# Patient Record
Sex: Female | Born: 1950 | Race: White | Hispanic: No | Smoking: Former smoker
Health system: Southern US, Community
[De-identification: ages and names within clinical notes are randomized; demographics above are authoritative.]

## PROBLEM LIST (undated history)

## (undated) DIAGNOSIS — F32A Depression, unspecified: Secondary | ICD-10-CM

## (undated) DIAGNOSIS — Z86010 Personal history of colonic polyps: Principal | ICD-10-CM

## (undated) DIAGNOSIS — I10 Essential (primary) hypertension: Secondary | ICD-10-CM

## (undated) DIAGNOSIS — C801 Malignant (primary) neoplasm, unspecified: Secondary | ICD-10-CM

## (undated) DIAGNOSIS — B019 Varicella without complication: Secondary | ICD-10-CM

## (undated) DIAGNOSIS — M1990 Unspecified osteoarthritis, unspecified site: Secondary | ICD-10-CM

## (undated) DIAGNOSIS — F329 Major depressive disorder, single episode, unspecified: Secondary | ICD-10-CM

## (undated) DIAGNOSIS — E079 Disorder of thyroid, unspecified: Secondary | ICD-10-CM

## (undated) DIAGNOSIS — N809 Endometriosis, unspecified: Secondary | ICD-10-CM

## (undated) DIAGNOSIS — J45909 Unspecified asthma, uncomplicated: Secondary | ICD-10-CM

## (undated) DIAGNOSIS — K644 Residual hemorrhoidal skin tags: Secondary | ICD-10-CM

## (undated) DIAGNOSIS — K648 Other hemorrhoids: Secondary | ICD-10-CM

## (undated) DIAGNOSIS — J189 Pneumonia, unspecified organism: Secondary | ICD-10-CM

## (undated) DIAGNOSIS — Z87440 Personal history of urinary (tract) infections: Secondary | ICD-10-CM

## (undated) DIAGNOSIS — K641 Second degree hemorrhoids: Secondary | ICD-10-CM

## (undated) DIAGNOSIS — J849 Interstitial pulmonary disease, unspecified: Secondary | ICD-10-CM

## (undated) DIAGNOSIS — E039 Hypothyroidism, unspecified: Secondary | ICD-10-CM

## (undated) DIAGNOSIS — T7840XA Allergy, unspecified, initial encounter: Secondary | ICD-10-CM

## (undated) HISTORY — DX: Disorder of thyroid, unspecified: E07.9

## (undated) HISTORY — PX: OTHER SURGICAL HISTORY: SHX169

## (undated) HISTORY — DX: Major depressive disorder, single episode, unspecified: F32.9

## (undated) HISTORY — DX: Unspecified osteoarthritis, unspecified site: M19.90

## (undated) HISTORY — DX: Unspecified asthma, uncomplicated: J45.909

## (undated) HISTORY — DX: Pneumonia, unspecified organism: J18.9

## (undated) HISTORY — DX: Varicella without complication: B01.9

## (undated) HISTORY — DX: Interstitial pulmonary disease, unspecified: J84.9

## (undated) HISTORY — DX: Personal history of colonic polyps: Z86.010

## (undated) HISTORY — DX: Depression, unspecified: F32.A

## (undated) HISTORY — PX: COLONOSCOPY W/ POLYPECTOMY: SHX1380

## (undated) HISTORY — DX: Residual hemorrhoidal skin tags: K64.4

## (undated) HISTORY — DX: Endometriosis, unspecified: N80.9

## (undated) HISTORY — DX: Essential (primary) hypertension: I10

## (undated) HISTORY — DX: Personal history of urinary (tract) infections: Z87.440

## (undated) HISTORY — DX: Other hemorrhoids: K64.8

## (undated) HISTORY — PX: CARDIAC CATHETERIZATION: SHX172

## (undated) HISTORY — DX: Hypothyroidism, unspecified: E03.9

## (undated) HISTORY — PX: HEMORRHOID BANDING: SHX5850

## (undated) HISTORY — DX: Allergy, unspecified, initial encounter: T78.40XA

## (undated) HISTORY — DX: Second degree hemorrhoids: K64.1

## (undated) HISTORY — DX: Malignant (primary) neoplasm, unspecified: C80.1

---

## 1998-07-11 ENCOUNTER — Inpatient Hospital Stay (HOSPITAL_COMMUNITY): Admission: AD | Admit: 1998-07-11 | Discharge: 1998-07-11 | Payer: Self-pay | Admitting: Obstetrics

## 1998-07-16 ENCOUNTER — Encounter: Payer: Self-pay | Admitting: Obstetrics and Gynecology

## 1998-07-16 ENCOUNTER — Ambulatory Visit (HOSPITAL_COMMUNITY): Admission: RE | Admit: 1998-07-16 | Discharge: 1998-07-16 | Payer: Self-pay | Admitting: Obstetrics

## 1998-10-02 ENCOUNTER — Other Ambulatory Visit: Admission: RE | Admit: 1998-10-02 | Discharge: 1998-10-02 | Payer: Self-pay | Admitting: Obstetrics and Gynecology

## 1998-10-02 ENCOUNTER — Encounter (INDEPENDENT_AMBULATORY_CARE_PROVIDER_SITE_OTHER): Payer: Self-pay | Admitting: Specialist

## 1999-01-12 ENCOUNTER — Other Ambulatory Visit: Admission: RE | Admit: 1999-01-12 | Discharge: 1999-01-12 | Payer: Self-pay | Admitting: Obstetrics and Gynecology

## 1999-07-13 ENCOUNTER — Other Ambulatory Visit: Admission: RE | Admit: 1999-07-13 | Discharge: 1999-07-13 | Payer: Self-pay | Admitting: Obstetrics and Gynecology

## 2000-08-22 ENCOUNTER — Encounter: Admission: RE | Admit: 2000-08-22 | Discharge: 2000-08-22 | Payer: Self-pay | Admitting: Internal Medicine

## 2000-08-22 ENCOUNTER — Encounter: Payer: Self-pay | Admitting: Internal Medicine

## 2002-07-30 ENCOUNTER — Other Ambulatory Visit: Admission: RE | Admit: 2002-07-30 | Discharge: 2002-07-30 | Payer: Self-pay | Admitting: Obstetrics and Gynecology

## 2003-10-30 ENCOUNTER — Other Ambulatory Visit: Admission: RE | Admit: 2003-10-30 | Discharge: 2003-10-30 | Payer: Self-pay | Admitting: Obstetrics and Gynecology

## 2004-01-21 ENCOUNTER — Ambulatory Visit: Payer: Self-pay | Admitting: Endocrinology

## 2004-03-17 ENCOUNTER — Ambulatory Visit: Payer: Self-pay | Admitting: Endocrinology

## 2004-05-29 ENCOUNTER — Ambulatory Visit: Payer: Self-pay | Admitting: Endocrinology

## 2004-06-10 ENCOUNTER — Ambulatory Visit: Payer: Self-pay | Admitting: Endocrinology

## 2004-06-19 ENCOUNTER — Ambulatory Visit: Payer: Self-pay | Admitting: Endocrinology

## 2004-12-01 ENCOUNTER — Ambulatory Visit: Payer: Self-pay | Admitting: Endocrinology

## 2005-02-03 ENCOUNTER — Ambulatory Visit: Payer: Self-pay | Admitting: Internal Medicine

## 2005-04-13 ENCOUNTER — Ambulatory Visit: Payer: Self-pay | Admitting: Internal Medicine

## 2005-06-24 ENCOUNTER — Ambulatory Visit: Payer: Self-pay | Admitting: Internal Medicine

## 2005-08-17 ENCOUNTER — Ambulatory Visit: Payer: Self-pay | Admitting: Internal Medicine

## 2005-08-18 ENCOUNTER — Ambulatory Visit: Payer: Self-pay | Admitting: Internal Medicine

## 2010-02-22 HISTORY — PX: TOTAL KNEE ARTHROPLASTY: SHX125

## 2010-03-06 ENCOUNTER — Ambulatory Visit (HOSPITAL_COMMUNITY)
Admission: RE | Admit: 2010-03-06 | Discharge: 2010-03-06 | Payer: Self-pay | Source: Home / Self Care | Attending: Cardiovascular Disease | Admitting: Cardiovascular Disease

## 2010-03-09 LAB — GLUCOSE, CAPILLARY: Glucose-Capillary: 82 mg/dL (ref 70–99)

## 2010-03-19 NOTE — Procedures (Addendum)
  Stacy Tucker, Stacy Tucker           ACCOUNT NO.:  [id number]  MEDICAL RECORD NO.:  [id number]          PATIENT TYPE:  OIB  LOCATION:  2899                         FACILITY:  [facility]  PHYSICIAN:  Thurmon Fair, MD     DATE OF BIRTH:  [birth year]  DATE OF PROCEDURE: DATE OF DISCHARGE:  03/06/2010                           CARDIAC CATHETERIZATION   PROCEDURES PERFORMED: 1. Left heart catheterization. 2. Selective coronary angiography. 3. Left ventriculography.  [name] is a 60 year old woman with atypical chest discomfort and an abnormal myocardial perfusion study suggestive of ischemia in the anterior wall.  After risks and benefits of the procedure were described, she provided informed consent, and was brought to the cardiac cath lab in fasting state.  She was prepped and draped in usual sterile fashion.  Using the modified Seldinger technique, a 5-French right common femoral sheath was introduced without difficulty.  Using 5-French JL-4 JR-4, and angled pigtail catheters selective cannulation of the left and right coronary arteries and left ventricle respectively performed.  Multiple coronary angiograms and a variety of projections as well as in the RAO projection, left ventriculogram were recorded.  A pullback from the left ventricle to the aorta was also recorded.  No immediate complications occurred.  FINDINGS:  [name] has normal coronary arteries.  There is no evidence of luminal irregularities to suggest the meaningful atherosclerosis.  There are definitely no hemodynamically significant stenoses.  The circulation is right coronary artery dominant, but right coronary is relatively small and provides fairly short PDA and PLV branches.  The circumflex system is large providing four major oblique marginal arteries.  The LAD artery generates only one important diagonal branch.  The left ventricle is normal in size, regional wall motion overall systolic function  with ejection fraction of 55-60%.  The left ventricular end-diastolic pressure is minimally elevated at 18 mmHg. This is probably attributable to hypertension and left ventricular hypertrophy.  CONCLUSION:  [name] has normal coronary arteries.  The myocardial perfusion defect is likely due to "shifting breast" attenuation artifact.  No further coronary workup is necessary at this time.     Thurmon Fair, MD     MC/MEDQ  D:  03/06/2010  T:  03/07/2010  Job:  [id number]  cc:   [facility]  Electronically Signed by Thurmon Fair M.D. on 03/19/2010 12:15:21 PM

## 2010-07-17 ENCOUNTER — Other Ambulatory Visit (HOSPITAL_COMMUNITY): Payer: Self-pay

## 2010-07-17 ENCOUNTER — Encounter (HOSPITAL_COMMUNITY)
Admission: RE | Admit: 2010-07-17 | Discharge: 2010-07-17 | Disposition: A | Discharge status: Home / Self Care | Payer: BC Managed Care – PPO | Source: Ambulatory Visit | Attending: Orthopaedic Surgery | Admitting: Orthopaedic Surgery

## 2010-07-17 ENCOUNTER — Other Ambulatory Visit (HOSPITAL_COMMUNITY): Payer: BC Managed Care – PPO

## 2010-07-17 LAB — BASIC METABOLIC PANEL
BUN: 13 mg/dL (ref 6–23)
CO2: 28 mEq/L (ref 19–32)
Calcium: 10 mg/dL (ref 8.4–10.5)
Chloride: 105 mEq/L (ref 96–112)
Creatinine, Ser: 0.73 mg/dL (ref 0.4–1.2)
GFR calc Af Amer: >60 mL/min (ref >60)
GFR calc non Af Amer: >60 mL/min (ref >60)
Glucose, Bld: 109 mg/dL — ABNORMAL HIGH (ref 70–99)
Potassium: 4.7 mEq/L (ref 3.5–5.1)
Sodium: 139 mEq/L (ref 135–145)

## 2010-07-17 LAB — SURGICAL PCR SCREEN
MRSA, PCR: NEGATIVE
Staphylococcus aureus: NEGATIVE

## 2010-07-17 LAB — CBC
HCT: 37.7 % (ref 36.0–46.0)
Hemoglobin: 12.8 g/dL (ref 12.0–15.0)
MCH: 30.1 pg (ref 26.0–34.0)
MCHC: 34 g/dL (ref 30.0–36.0)
MCV: 88.7 fL (ref 78.0–100.0)
Platelets: 227 10*3/uL (ref 150–400)
RBC: 4.25 MIL/uL (ref 3.87–5.11)
RDW: 13 % (ref 11.5–15.5)
WBC: 7.1 10*3/uL (ref 4.0–10.5)

## 2010-07-17 LAB — TYPE AND SCREEN
ABO/RH(D): O POS
Antibody Screen: NEGATIVE

## 2010-07-17 LAB — PROTIME-INR
INR: 0.93 (ref 0.00–1.49)
Prothrombin Time: 12.7 seconds (ref 11.6–15.2)

## 2010-07-17 LAB — ABO/RH: ABO/RH(D): O POS

## 2010-07-17 LAB — APTT: aPTT: 30 seconds (ref 24–37)

## 2010-07-21 ENCOUNTER — Inpatient Hospital Stay (HOSPITAL_COMMUNITY)
Admission: RE | Admit: 2010-07-21 | Discharge: 2010-07-24 | DRG: 209 | Disposition: A | Discharge status: Home Health | Payer: BC Managed Care – PPO | Source: Ambulatory Visit | Attending: Orthopaedic Surgery | Admitting: Orthopaedic Surgery

## 2010-07-21 ENCOUNTER — Ambulatory Visit (HOSPITAL_COMMUNITY)
Admission: RE | Admit: 2010-07-21 | Discharge: 2010-07-21 | Disposition: A | Discharge status: Home / Self Care | Payer: BC Managed Care – PPO | Source: Ambulatory Visit | Attending: Orthopaedic Surgery | Admitting: Orthopaedic Surgery

## 2010-07-21 ENCOUNTER — Other Ambulatory Visit (HOSPITAL_COMMUNITY): Payer: Self-pay | Admitting: Orthopaedic Surgery

## 2010-07-21 DIAGNOSIS — F329 Major depressive disorder, single episode, unspecified: Secondary | ICD-10-CM | POA: Diagnosis present

## 2010-07-21 DIAGNOSIS — Z882 Allergy status to sulfonamides status: Secondary | ICD-10-CM

## 2010-07-21 DIAGNOSIS — F3289 Other specified depressive episodes: Secondary | ICD-10-CM | POA: Diagnosis present

## 2010-07-21 DIAGNOSIS — E039 Hypothyroidism, unspecified: Secondary | ICD-10-CM | POA: Diagnosis present

## 2010-07-21 DIAGNOSIS — M1712 Unilateral primary osteoarthritis, left knee: Secondary | ICD-10-CM

## 2010-07-21 DIAGNOSIS — M1710 Unilateral primary osteoarthritis, unspecified knee: Principal | ICD-10-CM | POA: Diagnosis present

## 2010-07-21 DIAGNOSIS — Z880 Allergy status to penicillin: Secondary | ICD-10-CM

## 2010-07-22 LAB — BASIC METABOLIC PANEL
BUN: 13 mg/dL (ref 6–23)
CO2: 29 mEq/L (ref 19–32)
Calcium: 8.8 mg/dL (ref 8.4–10.5)
Chloride: 100 mEq/L (ref 96–112)
Creatinine, Ser: 0.67 mg/dL (ref 0.4–1.2)
GFR calc Af Amer: >60 mL/min (ref >60)
GFR calc non Af Amer: >60 mL/min (ref >60)
Glucose, Bld: 128 mg/dL — ABNORMAL HIGH (ref 70–99)
Potassium: 4.7 mEq/L (ref 3.5–5.1)
Sodium: 136 mEq/L (ref 135–145)

## 2010-07-22 LAB — CBC
HCT: 34.7 % — ABNORMAL LOW (ref 36.0–46.0)
Hemoglobin: 11.5 g/dL — ABNORMAL LOW (ref 12.0–15.0)
MCH: 29.9 pg (ref 26.0–34.0)
MCHC: 33.1 g/dL (ref 30.0–36.0)
MCV: 90.4 fL (ref 78.0–100.0)
Platelets: 197 10*3/uL (ref 150–400)
RBC: 3.84 MIL/uL — ABNORMAL LOW (ref 3.87–5.11)
RDW: 13.2 % (ref 11.5–15.5)
WBC: 14.7 10*3/uL — ABNORMAL HIGH (ref 4.0–10.5)

## 2010-07-22 LAB — PROTIME-INR
INR: 1.13 (ref 0.00–1.49)
Prothrombin Time: 14.7 seconds (ref 11.6–15.2)

## 2010-07-23 LAB — PROTIME-INR
INR: 1.41 (ref 0.00–1.49)
Prothrombin Time: 17.5 seconds — ABNORMAL HIGH (ref 11.6–15.2)

## 2010-07-23 LAB — BASIC METABOLIC PANEL
BUN: 11 mg/dL (ref 6–23)
CO2: 29 mEq/L (ref 19–32)
Calcium: 8.5 mg/dL (ref 8.4–10.5)
Chloride: 102 mEq/L (ref 96–112)
Creatinine, Ser: 0.66 mg/dL (ref 0.4–1.2)
GFR calc Af Amer: >60 mL/min (ref >60)
GFR calc non Af Amer: >60 mL/min (ref >60)
Glucose, Bld: 136 mg/dL — ABNORMAL HIGH (ref 70–99)
Potassium: 4.1 mEq/L (ref 3.5–5.1)
Sodium: 137 mEq/L (ref 135–145)

## 2010-07-23 LAB — URINE MICROSCOPIC-ADD ON

## 2010-07-23 LAB — CBC
HCT: 30.5 % — ABNORMAL LOW (ref 36.0–46.0)
Hemoglobin: 10.1 g/dL — ABNORMAL LOW (ref 12.0–15.0)
MCH: 29.9 pg (ref 26.0–34.0)
MCHC: 33.1 g/dL (ref 30.0–36.0)
MCV: 90.2 fL (ref 78.0–100.0)
Platelets: 194 10*3/uL (ref 150–400)
RBC: 3.38 MIL/uL — ABNORMAL LOW (ref 3.87–5.11)
RDW: 13.6 % (ref 11.5–15.5)
WBC: 12.1 10*3/uL — ABNORMAL HIGH (ref 4.0–10.5)

## 2010-07-23 LAB — URINALYSIS, ROUTINE W REFLEX MICROSCOPIC
Bilirubin Urine: NEGATIVE
Glucose, UA: NEGATIVE mg/dL
Ketones, ur: NEGATIVE mg/dL
Nitrite: NEGATIVE
Protein, ur: NEGATIVE mg/dL
Specific Gravity, Urine: 1.011 (ref 1.005–1.030)
Urobilinogen, UA: 0.2 mg/dL (ref 0.0–1.0)
pH: 6 (ref 5.0–8.0)

## 2010-07-24 LAB — CBC
HCT: 30.6 % — ABNORMAL LOW (ref 36.0–46.0)
Hemoglobin: 10 g/dL — ABNORMAL LOW (ref 12.0–15.0)
MCH: 29.4 pg (ref 26.0–34.0)
MCHC: 32.7 g/dL (ref 30.0–36.0)
MCV: 90 fL (ref 78.0–100.0)
Platelets: 196 10*3/uL (ref 150–400)
RBC: 3.4 MIL/uL — ABNORMAL LOW (ref 3.87–5.11)
RDW: 13.4 % (ref 11.5–15.5)
WBC: 8.6 10*3/uL (ref 4.0–10.5)

## 2010-07-24 LAB — BASIC METABOLIC PANEL
BUN: 11 mg/dL (ref 6–23)
CO2: 30 mEq/L (ref 19–32)
Calcium: 8.6 mg/dL (ref 8.4–10.5)
Chloride: 103 mEq/L (ref 96–112)
Creatinine, Ser: 0.58 mg/dL (ref 0.4–1.2)
GFR calc Af Amer: >60 mL/min (ref >60)
GFR calc non Af Amer: >60 mL/min (ref >60)
Glucose, Bld: 114 mg/dL — ABNORMAL HIGH (ref 70–99)
Potassium: 3.8 mEq/L (ref 3.5–5.1)
Sodium: 139 mEq/L (ref 135–145)

## 2010-07-24 LAB — PROTIME-INR
INR: 1.56 — ABNORMAL HIGH (ref 0.00–1.49)
Prothrombin Time: 18.9 seconds — ABNORMAL HIGH (ref 11.6–15.2)

## 2010-07-27 NOTE — Op Note (Signed)
NAMETHAI, HEMRICK           ACCOUNT NO.:  [id number]  MEDICAL RECORD NO.:  [id number]           PATIENT TYPE:  I  LOCATION:  5023                         FACILITY:  [facility]  PHYSICIAN:  Lubertha Basque. Windle Huebert, M.D.DATE OF BIRTH:  [birth year]  DATE OF PROCEDURE:  07/21/2010 DATE OF DISCHARGE:                              OPERATIVE REPORT   PREOPERATIVE DIAGNOSIS:  Left knee degenerative joint disease.  POSTOPERATIVE DIAGNOSIS:  Left knee degenerative joint disease.  PROCEDURE:  Left total knee replacement.  ANESTHESIA:  General and block.  ATTENDING SURGEON:  Lubertha Basque. Jerl Santos, MD  ASSISTANT:  Lindwood Qua, PA   INDICATIONS FOR PROCEDURE:  The patient is a 60 year old woman with a long history of painful left knee.  This has persisted despite oral anti- inflammatories and multiple injectables.  She has pain, which limits her ability to rest and walk.  Preoperative x-ray shows she has advanced degenerative change.  She is offered a knee replacement.  Informed operative consent was obtained after discussion of possible complications including reaction to anesthesia, infection, DVT, PE, and death.  The importance of postoperative rehabilitation protocol to optimize result was stressed with the patient.  SUMMARY OF FINDINGS AND PROCEDURE:  Under general anesthesia and block, a left knee replacement was performed.  She had advanced degenerative change in the medial compartment, but excellent bone quality.  We addressed her problem with cemented DePuy LCS system using standard plus femur, 4 tibia, 10 deep dish spacer, and 38 all polyethylene patella. Bryna Colander assisted throughout and was invaluable to the completion of the case in that he helped position and retract while I performed the procedure.  He also closed simultaneously to help minimize OR time.  I did include Zinacef antibiotic in cement.  DESCRIPTION OF PROCEDURE:  The patient was taken to the operating  suite where general anesthetic was applied without difficulty.  She was also given a block in the preanesthesia area.  She was positioned supine, prepped and draped in normal sterile fashion.  After administration of IV Kefzol, the left leg was elevated, exsanguinated, tourniquet inflated about the thigh.  A longitudinal anterior incision was made with dissection down the extensor mechanism.  All appropriate anti-infected measures were used including closed hooded exhaust systems for each member of surgical team, Betadine impregnated drape, and preoperative IV antibiotic.  A medial parapatellar incision was made.  The kneecap was flipped and the knee flexed.  Some residual meniscal tissues were removed along with the ACL and PCL.  An extramedullary guide was placed in the tibia to make a cut with the rubber with a flat surface.  We then placed an intramedullary guide to the femur to make anterior-posterior cuts creating a flexion gap of 10 mm.  A second intramedullary guide was placed in the femur to make a distal cut creating an equal extension gap of 10 mm.  The femur was sized to a standard plus and the tibia to a 4 with the appropriate guides placed and utilized.  The patella was cut down in thickness by about 10 mm to 15 and sized to a 38 with the appropriate guide placed and utilized.  Trial reduction was done with all these components in the tenth spacer.  She easily came to slight hyperextension and the patella tracked well.  Trial components were removed followed by pulsatile lavage irrigation of all three cut bony surfaces.  Cement was mixed including Zinacef antibiotic.  This was eventually placed onto the bones and pressurized.  The aforementioned DePuy components were then placed.  Excess cement was trimmed and pressure was held in the components until cement hardened.  The tourniquet was deflated and a small amount of bleeding was easily controlled with some pressure and  Bovie cautery.  The knee was irrigated followed by placement of drain exiting superolaterally.  The extensor mechanism was reapproximated with #1 Vicryl in interrupted fashion followed by subcutaneous reapproximation of the patient with 0 and 2-0 undyed Vicryl and skin closure with a running suture of Monocryl.  Steri- Strips were applied followed by Adaptic, dry gauze, and loose Ace wrap. Estimated blood loss and intraoperative fluids can be obtained from anesthesia records as can accurate tourniquet time.  DISPOSITION:  The patient was extubated in the operating room and taken to the recovery room in stable condition.  She was to be admitted to the Orthopedic Surgery Service for appropriate postop care to include perioperative antibiotics and Coumadin plus Lovenox for DVT prophylaxis.     Lubertha Basque Jerl Santos, M.D.     PGD/MEDQ  D:  07/21/2010  T:  07/22/2010  Job:  [id number]  Electronically Signed by Marcene Corning M.D. on 07/27/2010 01:39:35 PM

## 2010-08-25 NOTE — Discharge Summary (Signed)
Stacy Tucker, Stacy Tucker           ACCOUNT NO.:  [id number]  MEDICAL RECORD NO.:  [id number]           PATIENT TYPE:  I  LOCATION:  5023                         FACILITY:  [facility]  PHYSICIAN:  Lubertha Basque. Keishawn Rajewski, M.D.DATE OF BIRTH:  06-May-1950  DATE OF ADMISSION:  07/21/2010 DATE OF DISCHARGE:  07/24/2010                              DISCHARGE SUMMARY   ADMITTING DIAGNOSES: 1. Left knee end-stage degenerative joint disease. 2. Depression. 3. History of hypothyroid.  DISCHARGE DIAGNOSES: 1. Left knee end-stage degenerative joint disease. 2. Depression. 3. History of hypothyroid.  OPERATION:  Left total knee replacement.  BRIEF HISTORY:  Stacy Tucker is 60 years old.  She has had increasing left knee pain unrelieved by oral anti-inflammatory medicines, corticosteroid injections, now is having pain with every step and trouble sleeping at nighttime.  Her x-rays reveal end-stage bone-on-bone DJD.  We have discussed treatment options with her that being total knee replacement.  PERTINENT LABORATORY AND X-RAY FINDINGS:  Urinalysis was done was essentially normal because of urinary burning.  Hemoglobin 10.1, hematocrit 30.5, WBCs 12.5, platelets of 194.  Sodium 137, potassium 4.1, creatinine 0.66, BUN 11, glucose 136, last INR available at dictation 1.41.  COURSE IN THE HOSPITAL:  She was admitted postoperatively, placed on a variety of p.o. and IV analgesics for pain including a PCA pump of Dilaudid.  Her pain controlled on a reduced dose.  Her diet was advanced as tolerated.  IV fluid was continue for the first 24-48 hours. Coumadin and Lovenox DVT prophylaxis protocol by pharmacy and then perioperative antibiotics which was Ancef x3 doses.  Appropriate stool softeners, antiemetics, antispasmodics were also used.  She can be weightbearing as tolerated with physical therapy.  The use of a CPM machine 0-50 and advancing as tolerated and Foley catheter was discontinued as well the  first day postop.  Followup lab studies were done as well and were noted in the chart. First day postop, blood pressure was within normal limits.  Temperature was 98, but did spike to 101 on a couple of occasions during her hospital stay, then a Tylenol was used, which brought her temperature down as well as incentive spirometry and changing some of her medications.  Her lungs were clear.  Abdomen was soft.  She had not had a bowel movement.  Knee was within normal limits on left side without sign of infection.  Negative Homans.  No sign of DVT.  Foley catheter was removed on the second day postop.  The drain was pulled out accidentally, which was fine because it was going to come out anyway. Dressing was changed.  Wound was noted to be benign.  Vital signs remained stable with intermittent spikes of temperature up to 100+, but for the most part, she was in the 98 range.  Her knee showed no sign of infection.  Her lungs were clear and urine was normal.  She progressed well with physical therapy and was discharged home on July 24, 2010.  CONDITION ON DISCHARGE:  Improved.  FOLLOWUP:  She will remain on a low-sodium heart-healthy diet.  May change her dressing daily and home care agency will provide home physical  therapy and blood draws for her on discharge with an INR goal between 2.0 and 3.0 because of Coumadin.  She will be on it for 2 weeks. She was also given appropriate pain medicine prescription and antispasmodic with follow up with [place] in 2 weeks, calling [phone] for an appointment or sooner if there is any sign of infection which would be increasing redness, drainage, or pain.     Lindwood Qua, P.A.   ______________________________ Lubertha Basque. Jerl Santos, M.D.    MC/MEDQ  D:  07/23/2010  T:  07/24/2010  Job:  [id number]  Electronically Signed by Lindwood Qua P.A. on 08/04/2010 04:01:55 PM Electronically Signed by Marcene Corning M.D. on 08/25/2010 05:03:38 PM

## 2010-08-25 NOTE — H&P (Signed)
  Stacy Tucker, Stacy Tucker           ACCOUNT NO.:  [id number]  MEDICAL RECORD NO.:  [id number]           PATIENT TYPE:  LOCATION:                                 FACILITY:  PHYSICIAN:  Lubertha Basque. Roniqua Kintz, M.D.DATE OF BIRTH:  1950-10-03  DATE OF ADMISSION:  07/21/2010 DATE OF DISCHARGE:   07/24/2010                             HISTORY & PHYSICAL   CHIEF COMPLAINT:  Left knee pain.  HISTORY OF PRESENT ILLNESS:  Stacy Tucker is a patient well-known to our practice who was having considerable left knee pain when she ambulates, trouble sleeping at nighttime.  She has undergone Supartz injections and got minimal relief for about 6 months but at this point she is frustrated with knee pain, trouble sleeping at night trouble ambulating, and we have discussed treatment options that being total knee replacement with the risk of anesthesia, infection, DVT, and possible death and significant additional past medical history.  DRUG ALLERGIES:  PENICILLIN, MACRODANTIN, AUGMENTIN, SULFA, but is able to take Keflex without any significant problems.  She also has allergies to VALIUM and PRISTIQ.  CURRENT MEDICATION:  Thyroid replacement.  She does have history of mild depression but is on no medications at this time for this.  She works as a Runner, broadcasting/film/video in [place].  Medical coverage is through Marcelle Overlie who is her OB/GYN doctor.  She does not smoke.  REVIEW OF SYSTEMS:  Additional 14 point review of systems are that she wears glasses, has ringing in her ears, history of a rash, history of depression, history of nervousness.  PREVIOUS HOSPITALIZATIONS:  Pregnancy for childbirth, has never been under general anesthesia.  FAMILY HISTORY:  Negative for diabetes, positive for hypertension. Negative for heart disease.  Positive for arthritis.  SOCIAL HISTORY:  Does not smoke, does not drink alcohol.  Works as a Runner, broadcasting/film/video in [employer].  PHYSICAL EXAMINATION:  GENERAL:  Well  oriented appropriately.  Speech and behavior well developed, well nourished upper and lower extremities. No sign of infection with good neurovascular status. EYES:  PERRLA.  Visual fields are normal. LUNGS:  No cervical adenopathy noted. LUNGS:  Clear to A&P. CARDIAC:  Regular rate and rhythm.  S1 and S2 noted. ABDOMEN:  Soft with positive bowel sounds. EXTREMITIES:  Knee motion on the left side is 0-90 degrees.  Right side is 0-100.  She has intense medial joint line pain on the left with significant crepitation.  Hip motion is full and pain free.  Straight- leg raising is negative.  No significant effusion noted.  ASSESSMENT: 1. Left knee end-stage degenerative joint disease. 2. History of depression.  PLAN:  Do a left total knee replacement and then admit to the hospital for appropriate pain control, physical therapy until discharged home.     Lindwood Qua, P.A.   ______________________________ Lubertha Basque. Jerl Santos, M.D.    MC/MEDQ  D:  07/20/2010  T:  07/20/2010  Job:  [id number]  Electronically Signed by Lindwood Qua P.A. on 08/04/2010 11:46:54 AM Electronically Signed by Marcene Corning M.D. on 08/25/2010 05:03:35 PM

## 2014-03-07 ENCOUNTER — Other Ambulatory Visit (HOSPITAL_COMMUNITY): Payer: Self-pay | Admitting: Obstetrics and Gynecology

## 2014-03-07 ENCOUNTER — Ambulatory Visit (HOSPITAL_COMMUNITY)
Admission: RE | Admit: 2014-03-07 | Discharge: 2014-03-07 | Disposition: A | Discharge status: Home / Self Care | Payer: BC Managed Care – PPO | Source: Ambulatory Visit | Attending: Obstetrics and Gynecology | Admitting: Obstetrics and Gynecology

## 2014-03-07 DIAGNOSIS — R0989 Other specified symptoms and signs involving the circulatory and respiratory systems: Secondary | ICD-10-CM | POA: Diagnosis not present

## 2014-03-07 DIAGNOSIS — R059 Cough, unspecified: Secondary | ICD-10-CM

## 2014-03-07 DIAGNOSIS — R05 Cough: Secondary | ICD-10-CM | POA: Diagnosis present

## 2014-03-08 ENCOUNTER — Encounter: Payer: Self-pay | Admitting: Internal Medicine

## 2014-04-03 ENCOUNTER — Ambulatory Visit (AMBULATORY_SURGERY_CENTER): Payer: Self-pay | Admitting: *Deleted

## 2014-04-03 VITALS — Ht 64.5 in | Wt 205.0 lb

## 2014-04-03 DIAGNOSIS — Z1211 Encounter for screening for malignant neoplasm of colon: Secondary | ICD-10-CM

## 2014-04-03 NOTE — Progress Notes (Signed)
No egg or soy allergy No diet pills No home 02 use No issues with sedation emmi video to email

## 2014-04-17 ENCOUNTER — Encounter: Payer: Self-pay | Admitting: Internal Medicine

## 2014-04-17 ENCOUNTER — Ambulatory Visit (AMBULATORY_SURGERY_CENTER): Payer: BC Managed Care – PPO | Admitting: Internal Medicine

## 2014-04-17 VITALS — BP 154/72 | HR 50 | Temp 98.3°F | Resp 13 | Ht 64.5 in | Wt 205.0 lb

## 2014-04-17 DIAGNOSIS — K5730 Diverticulosis of large intestine without perforation or abscess without bleeding: Secondary | ICD-10-CM

## 2014-04-17 DIAGNOSIS — Z1211 Encounter for screening for malignant neoplasm of colon: Secondary | ICD-10-CM

## 2014-04-17 DIAGNOSIS — D124 Benign neoplasm of descending colon: Secondary | ICD-10-CM

## 2014-04-17 MED ORDER — SODIUM CHLORIDE 0.9 % IV SOLN: 500.0000 mL | INTRAVENOUS | Status: DC

## 2014-04-17 NOTE — Progress Notes (Signed)
Report to PACU, RN, vss, BBS= Clear.  

## 2014-04-17 NOTE — Progress Notes (Signed)
Called to room to assist during endoscopic procedure.  Patient ID and intended procedure confirmed with present staff. Received instructions for my participation in the procedure from the performing physician.  

## 2014-04-17 NOTE — Patient Instructions (Addendum)
I found and removed one small polyp that looks benign. You also have a condition called diverticulosis - common and not usually a problem. Please read the handout provided. Hemorrhoids and anal skin tags also noted. If you have hemorrhoid problems (swelling, itching, bleeding) I may able to treat those with an in-office procedure. If you like, please call my office at [phone] to schedule an appointment and I can evaluate you further.   I will let you know pathology results and when to have another routine colonoscopy by mail.  I appreciate the opportunity to care for you. Gatha Mayer, MD, [facility]   Discharge instructions given. Handouts on polyps,diverticulosis and hemorrhoids. Resume previous medications. YOU HAD AN ENDOSCOPIC PROCEDURE TODAY AT [facility]: Refer to the procedure report that was given to you for any specific questions about what was found during the examination.  If the procedure report does not answer your questions, please call your gastroenterologist to clarify.  If you requested that your care partner not be given the details of your procedure findings, then the procedure report has been included in a sealed envelope for you to review at your convenience later.  YOU SHOULD EXPECT: Some feelings of bloating in the abdomen. Passage of more gas than usual.  Walking can help get rid of the air that was put into your GI tract during the procedure and reduce the bloating. If you had a lower endoscopy (such as a colonoscopy or flexible sigmoidoscopy) you may notice spotting of blood in your stool or on the toilet paper. If you underwent a bowel prep for your procedure, then you may not have a normal bowel movement for a few days.  DIET: Your first meal following the procedure should be a light meal and then it is ok to progress to your normal diet.  A half-sandwich or bowl of soup is an example of a good first meal.  Heavy or fried foods are harder to  digest and may make you feel nauseous or bloated.  Likewise meals heavy in dairy and vegetables can cause extra gas to form and this can also increase the bloating.  Drink plenty of fluids but you should avoid alcoholic beverages for 24 hours.  ACTIVITY: Your care partner should take you home directly after the procedure.  You should plan to take it easy, moving slowly for the rest of the day.  You can resume normal activity the day after the procedure however you should NOT DRIVE or use heavy machinery for 24 hours (because of the sedation medicines used during the test).    SYMPTOMS TO REPORT IMMEDIATELY: A gastroenterologist can be reached at any hour.  During normal business hours, 8:30 AM to 5:00 PM Monday through Friday, call [phone].  After hours and on weekends, please call the GI answering service at [phone] who will take a message and have the physician on call contact you.   Following lower endoscopy (colonoscopy or flexible sigmoidoscopy):  Excessive amounts of blood in the stool  Significant tenderness or worsening of abdominal pains  Swelling of the abdomen that is new, acute  Fever of 100F or higher FOLLOW UP: If any biopsies were taken you will be contacted by phone or by letter within the next 1-3 weeks.  Call your gastroenterologist if you have not heard about the biopsies in 3 weeks.  Our staff will call the home number listed on your records the next business day following your procedure to  check on you and address any questions or concerns that you may have at that time regarding the information given to you following your procedure. This is a courtesy call and so if there is no answer at the home number and we have not heard from you through the emergency physician on call, we will assume that you have returned to your regular daily activities without incident.  SIGNATURES/CONFIDENTIALITY: You and/or your care partner have signed paperwork which will be entered  into your electronic medical record.  These signatures attest to the fact that that the information above on your After Visit Summary has been reviewed and is understood.  Full responsibility of the confidentiality of this discharge information lies with you and/or your care-partner.

## 2014-04-17 NOTE — Op Note (Signed)
[place]  [address] [place], [id number]   COLONOSCOPY PROCEDURE REPORT  PATIENT: Stacy Tucker, Stacy Tucker  MR#: [id number] BIRTHDATE: August 13, 1950 , 12  yrs. old GENDER: female ENDOSCOPIST: Gatha Mayer, MD, [facility] PROCEDURE DATE:  04/17/2014 PROCEDURE:   Colonoscopy with snare polypectomy First Screening Colonoscopy - Avg.  risk and is 50 yrs.  old or older Yes.  Prior Negative Screening - Now for repeat screening. N/A  History of Adenoma - Now for follow-up colonoscopy & has been > or = to 3 yrs.  N/A  Polyps Removed Today? Yes. ASA CLASS:   Class II INDICATIONS:average risk patient for colorectal cancer. MEDICATIONS: Propofol 300 mg IV and Monitored anesthesia care  DESCRIPTION OF PROCEDURE:   After the risks benefits and alternatives of the procedure were thoroughly explained, informed consent was obtained.  The digital rectal exam revealed several skin tags and revealed no rectal mass.   The LB [id number] endoscope was introduced through the anus and advanced to the cecum, which was identified by both the appendix and ileocecal valve. No adverse events experienced.   The quality of the prep was excellent, using MiraLax  The instrument was then slowly withdrawn as the colon was fully examined.      COLON FINDINGS: 1) 6-7 mm semi-pedunculated distal descending colon polyp removed with cold snare and sent to pathology 2) Sigmoid and descending diverticulosis 3) Internal hemorhoids and external hemorrhoids, anal skin tags 4) Otherwise normal colonoscopy, excellent prep - first screening.  Retroflexed views revealed internal hemorrhoids and Retroflexed views revealed external hemorrhoids. The time to cecum=12 minutes 09 seconds.  Withdrawal time=12 minutes 53 seconds.  The scope was withdrawn and the procedure completed. COMPLICATIONS: There were no immediate complications.  ENDOSCOPIC IMPRESSION: 1) 6-7 mm semi-pedunculated distal descending colon  polyp removed with cold snare and sent to pathology 2) Sigmoid and descending diverticulosis 3) Internal hemorhoids and external hemorrhoids, anal skin tags 4) Otherwise normal colonoscopy, excellent prep - first screening  RECOMMENDATIONS: Timing of repeat colonoscopy will be determined by pathology findings.  eSigned:  Gatha Mayer, MD, [facility] 04/17/2014 3:12 PM   cc: Dian Queen, MD and The Patient

## 2014-04-18 ENCOUNTER — Telehealth: Payer: Self-pay | Admitting: *Deleted

## 2014-04-18 NOTE — Telephone Encounter (Signed)
  Follow up Call-  Call back number 04/17/2014  Post procedure Call Back phone  # [phone] cell  Permission to leave phone message Yes    [facility]

## 2014-04-24 ENCOUNTER — Encounter: Payer: Self-pay | Admitting: Internal Medicine

## 2014-04-24 DIAGNOSIS — Z860101 Personal history of adenomatous and serrated colon polyps: Secondary | ICD-10-CM

## 2014-04-24 DIAGNOSIS — Z86010 Personal history of colonic polyps: Secondary | ICD-10-CM

## 2014-04-24 HISTORY — DX: Personal history of colonic polyps: Z86.010

## 2014-04-24 HISTORY — DX: Personal history of adenomatous and serrated colon polyps: Z86.0101

## 2014-04-24 NOTE — Progress Notes (Signed)
Quick Note:  Single adenoma < 1 cm repeat colon 2021 ______

## 2014-10-14 ENCOUNTER — Encounter: Payer: Self-pay | Admitting: Internal Medicine

## 2014-12-24 ENCOUNTER — Ambulatory Visit: Payer: BC Managed Care – PPO | Admitting: Internal Medicine

## 2015-07-07 ENCOUNTER — Ambulatory Visit: Payer: BC Managed Care – PPO | Admitting: Internal Medicine

## 2015-08-04 ENCOUNTER — Encounter (INDEPENDENT_AMBULATORY_CARE_PROVIDER_SITE_OTHER): Payer: Self-pay

## 2015-08-04 ENCOUNTER — Encounter: Payer: Self-pay | Admitting: Internal Medicine

## 2015-08-04 ENCOUNTER — Telehealth: Payer: Self-pay | Admitting: Internal Medicine

## 2015-08-04 ENCOUNTER — Ambulatory Visit (INDEPENDENT_AMBULATORY_CARE_PROVIDER_SITE_OTHER): Payer: Medicare Other | Admitting: Internal Medicine

## 2015-08-04 VITALS — BP 136/84 | HR 60 | Temp 98.2°F | Ht 64.5 in | Wt 215.8 lb

## 2015-08-04 DIAGNOSIS — E039 Hypothyroidism, unspecified: Secondary | ICD-10-CM | POA: Diagnosis not present

## 2015-08-04 DIAGNOSIS — F329 Major depressive disorder, single episode, unspecified: Secondary | ICD-10-CM | POA: Diagnosis not present

## 2015-08-04 DIAGNOSIS — F32A Depression, unspecified: Secondary | ICD-10-CM

## 2015-08-04 DIAGNOSIS — J302 Other seasonal allergic rhinitis: Secondary | ICD-10-CM

## 2015-08-04 DIAGNOSIS — Z23 Encounter for immunization: Secondary | ICD-10-CM

## 2015-08-04 DIAGNOSIS — I10 Essential (primary) hypertension: Secondary | ICD-10-CM

## 2015-08-04 MED ORDER — ZOSTER VACCINE LIVE 19400 UNT/0.65ML ~~LOC~~ SUSR: 0.6500 mL | Freq: Once | SUBCUTANEOUS | Status: DC

## 2015-08-04 NOTE — Progress Notes (Signed)
HPI  Pt presents to the clinic today to establish care and for management of the conditions listed below. She is transferring care from [facility]. She plans to still go there for her gynecological care.  Depression: She reports this is mild but stable. She takes Celexa 5 mg daily. It is triggered by her having to care for her elderly parents and the loss of her twin grandchildren who were born at 16 weeks. Her daughter has also had 2 subsequent miscarriages, which weights heavily on her. She denies SI/HI.  Hypothyroidism: Labs last checked 02/2015- reviewed. She denies any issues on her current dose of Synthroid.   Seasonal Allergies: Vernell Barrier is the spring and fall. She takes Ibuprofen and Sudafed with good relief.  HTN: Her BP is well controlled on Atenolol. She has no history of tachyarrhythmia. ECG from 2012 reviewed.  Flu: never Prevnar: never Pneumovax: never Zostovax: never Colon Screening: 03/2014, 5 years Pap Smear: 02/2015 Mammogram: 02/2015 Vision Screening: 04/2015, Dr. Andrew Au Dentist: biannually  Past Medical History  Diagnosis Date  . Vaginal delivery   . Allergy     seasonal  . Anemia     after child birth  . Cancer ([place])     skin cancer face, back   . Depression   . Hypertension   . H/O bladder infections     as child/teen  . Endometriosis   . Thyroid disease   . External hemorrhoid   . Pneumonia   . Hx of adenomatous polyp of colon 04/24/2014  . Chicken pox     Current Outpatient Prescriptions  Medication Sig Dispense Refill  . atenolol (TENORMIN) 25 MG tablet Take 25 mg by mouth daily.    . cholecalciferol (VITAMIN D) 1000 units tablet Take 2,000 Units by mouth daily.    . citalopram (CELEXA) 20 MG tablet Take 5 mg by mouth daily.   12  . estradiol (ESTRACE) 0.1 MG/GM vaginal cream Place 1 Applicatorful vaginally once a week.    . levothyroxine (SYNTHROID, LEVOTHROID) 75 MCG tablet Take 75 mcg by mouth daily before breakfast.    . Omega-3 Fatty  Acids (FISH OIL) 1000 MG CAPS Take 2,000 mg by mouth daily.    . valACYclovir (VALTREX) 1000 MG tablet      No current facility-administered medications for this visit.    Allergies  Allergen Reactions  . Augmentin [Amoxicillin-Pot Clavulanate] Nausea And Vomiting  . Hydrocodone Other (See Comments)    Eyes would cross with this   . Influenza Vaccines Other (See Comments)    Fever, severe flu symptoms  . Lorazepam Hives  . Macrodantin [Nitrofurantoin Macrocrystal] Hives  . Penicillins Other (See Comments)    As child   . Pristiq [Desvenlafaxine] Other (See Comments)    Fever and legs stiff, couldn't ambulate   . Sulfa Antibiotics Rash    Family History  Problem Relation Age of Onset  . Colon cancer Paternal Grandmother 98    no intervention  . Arthritis Paternal Grandmother   . Dementia Paternal Grandmother   . Esophageal cancer Neg Hx   . Rectal cancer Neg Hx   . Stomach cancer Neg Hx   . Arthritis Mother   . Heart disease Mother   . Hypertension Mother   . Depression Mother   . Dementia Mother   . Arthritis Father   . Hypertension Brother   . Depression Brother   . Arthritis Daughter   . Breast cancer Maternal Aunt   . Alcohol abuse Paternal  Grandfather   . Hypertension Brother   . Depression Brother     Social History   Social History  . Marital Status: Married    Spouse Name: N/A  . Number of Children: N/A  . Years of Education: N/A   Occupational History  . Not on file.   Social History Main Topics  . Smoking status: Former Smoker    Types: Cigarettes    Quit date: 12/23/2013  . Smokeless tobacco: Never Used     Comment: very infrequently take a puff of a cig, no whole cigarette   . Alcohol Use: 0.0 oz/week    0 Standard drinks or equivalent per week     Comment: wine occasionally   . Drug Use: No  . Sexual Activity: Not on file   Other Topics Concern  . Not on file   Social History Narrative    ROS:  Constitutional: Denies fever,  malaise, fatigue, headache or abrupt weight changes.  HEENT: Denies eye pain, eye redness, ear pain, ringing in the ears, wax buildup, runny nose, nasal congestion, bloody nose, or sore throat. Respiratory: Denies difficulty breathing, shortness of breath, cough or sputum production.   Cardiovascular: Denies chest pain, chest tightness, palpitations or swelling in the hands or feet.  Gastrointestinal: Denies abdominal pain, bloating, constipation, diarrhea or blood in the stool.  GU: Denies frequency, urgency, pain with urination, blood in urine, odor or discharge. Musculoskeletal: Denies decrease in range of motion, difficulty with gait, muscle pain or joint pain and swelling.  Skin: Denies redness, rashes, lesions or ulcercations.  Neurological: Denies dizziness, difficulty with memory, difficulty with speech or problems with balance and coordination.  Psych: Denies anxiety, depression, SI/HI.  No other specific complaints in a complete review of systems (except as listed in HPI above).  PE:  BP 136/84 mmHg  Pulse 60  Temp(Src) 98.2 F (36.8 C) (Oral)  Ht 5' 4.5" (1.638 m)  Wt 215 lb 12 oz (97.864 kg)  BMI 36.47 kg/m2  SpO2 96% Wt Readings from Last 3 Encounters:  08/04/15 215 lb 12 oz (97.864 kg)  04/17/14 205 lb (92.987 kg)  04/03/14 205 lb (92.987 kg)    General: Appears her stated age, obese in NAD. Skin: Dry and intact. HEENT: Head: normal shape and size; Eyes: sclera white, no icterus, conjunctiva pink, PERRLA and EOMs intact;  Neck: Neck supple, trachea midline. No masses, lumps or thyromegaly present.  Cardiovascular: Normal rate and rhythm. S1,S2 noted.  No murmur, rubs or gallops noted. No JVD or BLE edema.  Pulmonary/Chest: Normal effort and positive vesicular breath sounds. No respiratory distress. No wheezes, rales or ronchi noted.  Neurological: Alert and oriented.  Psychiatric: Mood and affect normal. Behavior is normal. Judgment and thought content normal.      BMET    Component Value Date/Time   NA 139 07/24/2010 0425   K 3.8 07/24/2010 0425   CL 103 07/24/2010 0425   CO2 30 07/24/2010 0425   GLUCOSE 114* 07/24/2010 0425   BUN 11 07/24/2010 0425   CREATININE 0.58 07/24/2010 0425   CALCIUM 8.6 07/24/2010 0425   GFRNONAA >60 07/24/2010 0425   GFRAA  07/24/2010 0425    >60        The eGFR has been calculated using the MDRD equation. This calculation has not been validated in all clinical situations. eGFR's persistently <60 mL/min signify possible Chronic Kidney Disease.    Lipid Panel  No results found for: CHOL, TRIG, HDL, CHOLHDL, VLDL, LDLCALC  CBC    Component Value Date/Time   WBC 8.6 07/24/2010 0425   RBC 3.40* 07/24/2010 0425   HGB 10.0* 07/24/2010 0425   HCT 30.6* 07/24/2010 0425   PLT 196 07/24/2010 0425   MCV 90.0 07/24/2010 0425   MCH 29.4 07/24/2010 0425   MCHC 32.7 07/24/2010 0425   RDW 13.4 07/24/2010 0425    Hgb A1C No results found for: HGBA1C   Assessment and Plan:

## 2015-08-04 NOTE — Telephone Encounter (Signed)
Ok, Mel can you send RX to pharmacy

## 2015-08-04 NOTE — Patient Instructions (Signed)
Hypertension Hypertension, commonly called high blood pressure, is when the force of blood pumping through your arteries is too strong. Your arteries are the blood vessels that carry blood from your heart throughout your body. A blood pressure reading consists of a higher number over a lower number, such as 110/72. The higher number (systolic) is the pressure inside your arteries when your heart pumps. The lower number (diastolic) is the pressure inside your arteries when your heart relaxes. Ideally you want your blood pressure below 120/80. Hypertension forces your heart to work harder to pump blood. Your arteries may become narrow or stiff. Having untreated or uncontrolled hypertension can cause heart attack, stroke, kidney disease, and other problems. RISK FACTORS Some risk factors for high blood pressure are controllable. Others are not.  Risk factors you cannot control include:   Race. You may be at higher risk if you are African American.  Age. Risk increases with age.  Gender. Men are at higher risk than women before age 45 years. After age 65, women are at higher risk than men. Risk factors you can control include:  Not getting enough exercise or physical activity.  Being overweight.  Getting too much fat, sugar, calories, or salt in your diet.  Drinking too much alcohol. SIGNS AND SYMPTOMS Hypertension does not usually cause signs or symptoms. Extremely high blood pressure (hypertensive crisis) may cause headache, anxiety, shortness of breath, and nosebleed. DIAGNOSIS To check if you have hypertension, your health care provider will measure your blood pressure while you are seated, with your arm held at the level of your heart. It should be measured at least twice using the same arm. Certain conditions can cause a difference in blood pressure between your right and left arms. A blood pressure reading that is higher than normal on one occasion does not mean that you need treatment. If  it is not clear whether you have high blood pressure, you may be asked to return on a different day to have your blood pressure checked again. Or, you may be asked to monitor your blood pressure at home for 1 or more weeks. TREATMENT Treating high blood pressure includes making lifestyle changes and possibly taking medicine. Living a healthy lifestyle can help lower high blood pressure. You may need to change some of your habits. Lifestyle changes may include:  Following the DASH diet. This diet is high in fruits, vegetables, and whole grains. It is low in salt, red meat, and added sugars.  Keep your sodium intake below 2,300 mg per day.  Getting at least 30-45 minutes of aerobic exercise at least 4 times per week.  Losing weight if necessary.  Not smoking.  Limiting alcoholic beverages.  Learning ways to reduce stress. Your health care provider may prescribe medicine if lifestyle changes are not enough to get your blood pressure under control, and if one of the following is true:  You are 18-59 years of age and your systolic blood pressure is above 140.  You are 60 years of age or older, and your systolic blood pressure is above 150.  Your diastolic blood pressure is above 90.  You have diabetes, and your systolic blood pressure is over 140 or your diastolic blood pressure is over 90.  You have kidney disease and your blood pressure is above 140/90.  You have heart disease and your blood pressure is above 140/90. Your personal target blood pressure may vary depending on your medical conditions, your age, and other factors. HOME CARE INSTRUCTIONS    Have your blood pressure rechecked as directed by your health care provider.   Take medicines only as directed by your health care provider. Follow the directions carefully. Blood pressure medicines must be taken as prescribed. The medicine does not work as well when you skip doses. Skipping doses also puts you at risk for  problems.  Do not smoke.   Monitor your blood pressure at home as directed by your health care provider. SEEK MEDICAL CARE IF:   You think you are having a reaction to medicines taken.  You have recurrent headaches or feel dizzy.  You have swelling in your ankles.  You have trouble with your vision. SEEK IMMEDIATE MEDICAL CARE IF:  You develop a severe headache or confusion.  You have unusual weakness, numbness, or feel faint.  You have severe chest or abdominal pain.  You vomit repeatedly.  You have trouble breathing. MAKE SURE YOU:   Understand these instructions.  Will watch your condition.  Will get help right away if you are not doing well or get worse.   This information is not intended to replace advice given to you by your health care provider. Make sure you discuss any questions you have with your health care provider.   Document Released: 02/08/2005 Document Revised: 06/25/2014 Document Reviewed: 12/01/2012 Elsevier Interactive Patient Education 2016 [employer]  

## 2015-08-04 NOTE — Assessment & Plan Note (Signed)
Controlled on Atenolol Recent labs reviewed

## 2015-08-04 NOTE — Telephone Encounter (Signed)
Rx sent through [facility]  

## 2015-08-04 NOTE — Assessment & Plan Note (Signed)
Advised her to take Claritin prn when allergies flare

## 2015-08-04 NOTE — Assessment & Plan Note (Signed)
TSH and T4 reviewed Continue current dose of Synthroid for now

## 2015-08-04 NOTE — Assessment & Plan Note (Signed)
Support offered today Controlled on Celexa

## 2015-08-04 NOTE — Telephone Encounter (Signed)
Health ins called -shingles shot is covered. pt needs shingles shot rx sent to pharmacy  [facility] [address]  Thank you

## 2015-08-04 NOTE — Addendum Note (Signed)
Addended by: Lurlean Nanny on: 08/04/2015 11:53 AM   Modules accepted: Orders

## 2015-08-07 ENCOUNTER — Ambulatory Visit: Payer: BC Managed Care – PPO | Admitting: Internal Medicine

## 2015-12-24 ENCOUNTER — Encounter: Payer: Self-pay | Admitting: Primary Care

## 2015-12-24 ENCOUNTER — Ambulatory Visit (INDEPENDENT_AMBULATORY_CARE_PROVIDER_SITE_OTHER): Payer: Medicare Other | Admitting: Primary Care

## 2015-12-24 VITALS — BP 156/100 | HR 62 | Temp 97.7°F | Ht 64.5 in | Wt 218.2 lb

## 2015-12-24 DIAGNOSIS — J019 Acute sinusitis, unspecified: Secondary | ICD-10-CM | POA: Diagnosis not present

## 2015-12-24 MED ORDER — KETOROLAC TROMETHAMINE 60 MG/2ML IM SOLN
60.0000 mg | Freq: Once | INTRAMUSCULAR | Status: AC
  Administered 2015-12-24: 60 mg via INTRAMUSCULAR

## 2015-12-24 MED ORDER — AZITHROMYCIN 250 MG PO TABS: ORAL_TABLET | ORAL | 0 refills | Status: DC

## 2015-12-24 NOTE — Progress Notes (Signed)
Subjective:    Patient ID: Stacy Tucker, female    DOB: October 01, 1950, 65 y.o.   MRN: [id number]  HPI  Ms. Dejoie is a 65 year old female with a history of hypertension and seasonal allergies who presents today with a chief complaint of headache. Her headache is located to the right occipital lobe that has been present for 2 weeks. She also reports sinus pressure and fatigue for the past 2+ weeks without improvement. She was traveling in [place], [place] 2 weeks ago for which she first noticed her symptoms. Recently she's been moving furniture. She's been taking ibuprofen and Alka-Seltzer cold tablets with some improvement. She denies photophobia, phonophobia, nausea, fevers, cough, injury/trauma. She's blowing thick yellow mucous from her nasal cavity. Overall she's not noticed improvement.  Review of Systems  Constitutional: Positive for chills and fatigue. Negative for fever.  HENT: Positive for congestion and sinus pressure. Negative for ear pain, sneezing and sore throat.   Respiratory: Negative for cough and shortness of breath.   Cardiovascular: Negative for chest pain.  Neurological: Positive for headaches. Negative for dizziness.       Past Medical History:  Diagnosis Date  . Allergy    seasonal  . Cancer ([place])    skin cancer face, back   . Chicken pox   . Depression   . Endometriosis   . External hemorrhoid   . H/O bladder infections    as child/teen  . Hx of adenomatous polyp of colon 04/24/2014  . Hypertension   . Pneumonia   . Thyroid disease   . Vaginal delivery      Social History   Social History  . Marital status: Married    Spouse name: N/A  . Number of children: N/A  . Years of education: N/A   Occupational History  . Not on file.   Social History Main Topics  . Smoking status: Former Smoker    Types: Cigarettes    Quit date: 12/23/2013  . Smokeless tobacco: Never Used     Comment: very infrequently take a puff of a cig, no whole cigarette   .  Alcohol use 0.0 oz/week     Comment: wine occasionally   . Drug use: No  . Sexual activity: Yes   Other Topics Concern  . Not on file   Social History Narrative  . No narrative on file    Past Surgical History:  Procedure Laterality Date  . CARDIAC CATHETERIZATION     all normal   . COLONOSCOPY W/ POLYPECTOMY    . TOTAL KNEE ARTHROPLASTY Left 2012  . wisdom  teeth     no sedation with this, just novacaine    Family History  Problem Relation Age of Onset  . Colon cancer Paternal Grandmother 98    no intervention  . Arthritis Paternal Grandmother   . Dementia Paternal Grandmother   . Esophageal cancer Neg Hx   . Rectal cancer Neg Hx   . Stomach cancer Neg Hx   . Arthritis Mother   . Heart disease Mother   . Hypertension Mother   . Depression Mother   . Dementia Mother   . Arthritis Father   . Hypertension Brother   . Depression Brother   . Arthritis Daughter   . Breast cancer Maternal Aunt   . Alcohol abuse Paternal Grandfather   . Hypertension Brother   . Depression Brother     Allergies  Allergen Reactions  . Augmentin [Amoxicillin-Pot Clavulanate] Nausea And Vomiting  .  Hydrocodone Other (See Comments)    Eyes would cross with this   . Influenza Vaccines Other (See Comments)    Fever, severe flu symptoms  . Lorazepam Hives  . Macrodantin [Nitrofurantoin Macrocrystal] Hives  . Penicillins Other (See Comments)    As child   . Pristiq [Desvenlafaxine] Other (See Comments)    Fever and legs stiff, couldn't ambulate   . Sulfa Antibiotics Rash    Current Outpatient Prescriptions on File Prior to Visit  Medication Sig Dispense Refill  . atenolol (TENORMIN) 25 MG tablet Take 25 mg by mouth daily.    . cholecalciferol (VITAMIN D) 1000 units tablet Take 2,000 Units by mouth daily.    . citalopram (CELEXA) 20 MG tablet Take 5 mg by mouth daily.   12  . estradiol (ESTRACE) 0.1 MG/GM vaginal cream Place 1 Applicatorful vaginally once a week.    .  levothyroxine (SYNTHROID, LEVOTHROID) 75 MCG tablet Take 75 mcg by mouth daily before breakfast.    . Omega-3 Fatty Acids (FISH OIL) 1000 MG CAPS Take 2,000 mg by mouth daily.    . valACYclovir (VALTREX) 1000 MG tablet Take 1,000 mg by mouth as needed.     . Zoster Vaccine Live, PF, (ZOSTAVAX) 65784 UNT/0.65ML injection Inject 19,400 Units into the skin once. 1 each 0   No current facility-administered medications on file prior to visit.     BP (!) 156/100   Pulse 62   Temp 97.7 F (36.5 C) (Oral)   Ht 5' 4.5" (1.638 m)   Wt 218 lb 2.7 oz (99 kg)   SpO2 97%   BMI 36.87 kg/m    Objective:   Physical Exam  Constitutional: She appears well-nourished.  HENT:  Right Ear: Tympanic membrane and ear canal normal.  Left Ear: Tympanic membrane and ear canal normal.  Nose: Mucosal edema present. Right sinus exhibits no maxillary sinus tenderness and no frontal sinus tenderness. Left sinus exhibits no maxillary sinus tenderness and no frontal sinus tenderness.  Mouth/Throat: Oropharynx is clear and moist.  Eyes: Conjunctivae are normal. Pupils are equal, round, and reactive to light.  Neck: Neck supple.  Cardiovascular: Normal rate and regular rhythm.   Pulmonary/Chest: Effort normal and breath sounds normal. She has no wheezes. She has no rales.  Lymphadenopathy:    She has no cervical adenopathy.  Neurological: No cranial nerve deficit.  Skin: Skin is warm and dry.          Assessment & Plan:  Headache Secondary to Acute Sinusitis:  Sinus pressure x 2+ weeks, now with headache to occipital lobes. BP above goal in the clinic today, typically well managed on Atenolol. Exam mostly unremarkable, does appear tired/unwell. Given duration of symptoms and presentation, will treat for presumed bacterial sinus involvement. Rx for Zpak sent to pharmacy. Discussed antihistamines, flonase. IM Toradol provided for headache. Renal function WNL. Return precautions provided.    Sheral Flow, NP

## 2015-12-24 NOTE — Patient Instructions (Addendum)
Start Azithromycin antibiotics. Take 2 tablets by mouth today, then 1 tablet daily for 4 additional days.  You were provided with an injection of Toradol for your headache.  Monitor your blood pressure and report readings about 150/90 on a consistent basis.   Please notify us if no improvement in 3-4 days.  It was a pleasure meeting you!

## 2015-12-24 NOTE — Progress Notes (Signed)
Pre visit review using our clinic review tool, if applicable. No additional management support is needed unless otherwise documented below in the visit note. 

## 2015-12-26 ENCOUNTER — Telehealth: Payer: Self-pay

## 2015-12-26 NOTE — Telephone Encounter (Signed)
Pt called in; this AM BP 157/77; pt took atenolol this morning after taking BP.no CP,H/A,dizziness or SOB. Pt states she feels pretty good now. Pt wants to know if she should continue zpak; pt wonders if that could have caused elevated BP. Pt request cb. [facility] [address].

## 2015-12-26 NOTE — Telephone Encounter (Signed)
Message left for patient to return my call.  

## 2015-12-26 NOTE — Telephone Encounter (Signed)
Please notify patient that her BP has been elevated on prior office visits, even yesterday before she was prescribed the [place]. BP elevation could be due to her current sinus infection, therefore, she should continue the Zpak and follow up with her PCP for BP evaluation if it continues to remain elevated. Her blood pressure goal is less than 150/90.

## 2015-12-26 NOTE — Telephone Encounter (Signed)
Spoken and notified patient of Kate's comments. Patient verbalized understanding.  Also patient asked if is okay to take 2 tablet atenolol 25 mg to help with her blood pressure.  Per Anda Kraft okay to take 2 tablet of atenolol 25 mg and will need to schedule a follow up for [facility] regarding BP eval.  Patient verbalized understanding.

## 2015-12-26 NOTE — Telephone Encounter (Signed)
PLEASE NOTE: All timestamps contained within this report are represented as [place] Standard Time. CONFIDENTIALTY NOTICE: This fax transmission is intended only for the addressee. It contains information that is legally privileged, confidential or otherwise protected from use or disclosure. If you are not the intended recipient, you are strictly prohibited from reviewing, disclosing, copying using or disseminating any of this information or taking any action in reliance on or regarding this information. If you have received this fax in error, please notify us immediately by telephone so that we can arrange for its return to [place]. Phone: [phone], Toll-Free: [phone], Fax: [phone] Page: 1 of 2 Call Id: [id number] [place] Patient Name: Stacy Tucker Gender: Female DOB: 12/14/1950 Age: 65 Y 9 M 13 D Return Phone Number: [phone] (Primary), [phone] (Secondary) Address: City/State/Zip: [place] [address] Client [facility] - Client Client Site [place] - Day Physician Alma Friendly - NP Contact Type Call Who Is Calling Patient / Member / Family / Caregiver Call Type Triage / Clinical Relationship To Patient Self Return Phone Number [phone] (Primary) Chief Complaint Blood Pressure High Reason for Call Symptomatic / Request for [place] states that that she was seen in the office yesterday for sinus infection. Blood pressure going up and down. Not sure what to do. Appointment Disposition EMR Appointment Not Necessary Info pasted into Epic No PreDisposition Did not know what to do Translation No Nurse Assessment Nurse: Luther Parody, RN, Malachy Mood Date/Time (Eastern Time): 12/25/2015 6:54:44 PM Confirm and document reason for call. If symptomatic, describe symptoms. You must click the next  button to save text entered. ---Caller states that she was seen in the office yesterday for a sinus infection and her b/p was elevated at the office. She picked up a b/p monitor when at the drug store and has been checking her b/p all day today and it has been elevated. Currently it is 169/99 and she has a h/a that is a 6/10. PT takes atenolol 25 mg each am and just took a second one an hr ago. Has the patient traveled out of the country within the last 30 days? ---Not Applicable Does the patient have any new or worsening symptoms? ---Yes Will a triage be completed? ---Yes Related visit to physician within the last 2 weeks? ---Yes Does the PT have any chronic conditions? (i.e. diabetes, asthma, etc.) ---Yes List chronic conditions. ---HTN, hypothyroid Is this a behavioral health or substance abuse call? ---No Guidelines Guideline Title Affirmed Question Affirmed Notes Nurse Date/Time (Eastern Time) High Blood Pressure BP # 160/100 Luther Parody, RN, Malachy Mood 12/25/2015 6:57:02 PM Disp. Time Eilene Ghazi Time) Disposition Final User PLEASE NOTE: All timestamps contained within this report are represented as [place] Standard Time. CONFIDENTIALTY NOTICE: This fax transmission is intended only for the addressee. It contains information that is legally privileged, confidential or otherwise protected from use or disclosure. If you are not the intended recipient, you are strictly prohibited from reviewing, disclosing, copying using or disseminating any of this information or taking any action in reliance on or regarding this information. If you have received this fax in error, please notify us immediately by telephone so that we can arrange for its return to [place]. Phone: [phone], Toll-Free: [phone], Fax: [phone] Page: 2 of 2 Call Id: [id number] 12/25/2015 7:05:19 PM See PCP When Office is Open (within 3 days) Yes Luther Parody, RN, Erskine Speed Understands: Yes  Disagree/Comply: Comply Care Advice  Given Per Guideline SEE PCP WITHIN 3 DAYS: * The goal of blood pressure treatment for most people with hypertension is to keep the blood pressure under 140/90. For people that are 60 years or older, your doctor may instead want to keep the blood pressure under 150/90. HIGH BLOOD PRESSURE: CARE ADVICE given per High Blood Pressure (Adult) guideline. * You become worse. CALL BACK IF: * Your blood pressure is over 180/110 * Chest pain or difficulty breathing occurs * Weakness or numbness of the face, arm or leg on one side of the body occurs * Difficulty walking, difficulty talking, or severe headache occurs Comments User: Kassie Mends, RN Date/Time (Eastern Time): 12/25/2015 7:05:17 PM Pt is out of town in [place] and [place] be back before next wk.

## 2016-01-06 ENCOUNTER — Other Ambulatory Visit: Payer: Self-pay | Admitting: Primary Care

## 2016-01-06 DIAGNOSIS — J019 Acute sinusitis, unspecified: Secondary | ICD-10-CM

## 2016-01-06 NOTE — Telephone Encounter (Signed)
Ok to refill? Electronically refill request for   azithromycin (ZITHROMAX) 250 MG tablet  Last seen and prescribed on 12/24/2015.

## 2016-01-09 ENCOUNTER — Encounter: Payer: Self-pay | Admitting: Internal Medicine

## 2016-01-09 ENCOUNTER — Ambulatory Visit (INDEPENDENT_AMBULATORY_CARE_PROVIDER_SITE_OTHER): Payer: Medicare Other | Admitting: Internal Medicine

## 2016-01-09 VITALS — BP 132/72 | HR 70 | Temp 98.0°F | Wt 212.8 lb

## 2016-01-09 DIAGNOSIS — I10 Essential (primary) hypertension: Secondary | ICD-10-CM

## 2016-01-09 DIAGNOSIS — M542 Cervicalgia: Secondary | ICD-10-CM | POA: Diagnosis not present

## 2016-01-09 NOTE — Progress Notes (Signed)
Subjective:    Patient ID: Stacy Tucker, female    DOB: [birth year], 65 y.o.   MRN: [id number]  HPI  Pt presents to the clinic today, wanting to follow up of her HTN. She reports she feels like her BP has improved, since she stopped taking ASA, Sudafed. Her BP today is 132/72. She is still taking her Atenolol daily.  She also reports neck pain. This started 2 weeks ago after doing some heavy lifting. The pain is located at the base of her skull. She describes the pain as tightness. The pain does not radiate. It is causing her to have difficulty sleeping. She has been taking Tylenol with some relief. She reports massaging the area improves the pain.   Review of Systems      Past Medical History:  Diagnosis Date  . Allergy    seasonal  . Cancer ([place])    skin cancer face, back   . Chicken pox   . Depression   . Endometriosis   . External hemorrhoid   . H/O bladder infections    as child/teen  . Hx of adenomatous polyp of colon 04/24/2014  . Hypertension   . Pneumonia   . Thyroid disease   . Vaginal delivery     Current Outpatient Prescriptions  Medication Sig Dispense Refill  . atenolol (TENORMIN) 25 MG tablet Take 25 mg by mouth daily.    . cholecalciferol (VITAMIN D) 1000 units tablet Take 2,000 Units by mouth daily.    . citalopram (CELEXA) 20 MG tablet Take 5 mg by mouth daily as needed.   12  . estradiol (ESTRACE) 0.1 MG/GM vaginal cream Place 1 Applicatorful vaginally once a week.    . levothyroxine (SYNTHROID, LEVOTHROID) 75 MCG tablet Take 75 mcg by mouth daily before breakfast.    . Omega-3 Fatty Acids (FISH OIL) 1000 MG CAPS Take 2,000 mg by mouth daily.    . valACYclovir (VALTREX) 1000 MG tablet Take 1,000 mg by mouth as needed.      No current facility-administered medications for this visit.     Allergies  Allergen Reactions  . Augmentin [Amoxicillin-Pot Clavulanate] Nausea And Vomiting  . Hydrocodone Other (See Comments)    Eyes would cross with this   . Influenza Vaccines Other (See Comments)    Fever, severe flu symptoms  . Lorazepam Hives  . Macrodantin [Nitrofurantoin Macrocrystal] Hives  . Penicillins Other (See Comments)    As child   . Pristiq [Desvenlafaxine] Other (See Comments)    Fever and legs stiff, couldn't ambulate   . Sulfa Antibiotics Rash    Family History  Problem Relation Age of Onset  . Colon cancer Paternal Grandmother 98    no intervention  . Arthritis Paternal Grandmother   . Dementia Paternal Grandmother   . Esophageal cancer Neg Hx   . Rectal cancer Neg Hx   . Stomach cancer Neg Hx   . Arthritis Mother   . Heart disease Mother   . Hypertension Mother   . Depression Mother   . Dementia Mother   . Arthritis Father   . Hypertension Brother   . Depression Brother   . Arthritis Daughter   . Breast cancer Maternal Aunt   . Alcohol abuse Paternal Grandfather   . Hypertension Brother   . Depression Brother     Social History   Social History  . Marital status: Married    Spouse name: N/A  . Number of children: N/A  . Years of education:  N/A   Occupational History  . Not on file.   Social History Main Topics  . Smoking status: Former Smoker    Types: Cigarettes    Quit date: 12/23/2013  . Smokeless tobacco: Never Used     Comment: very infrequently take a puff of a cig, no whole cigarette   . Alcohol use 0.0 oz/week     Comment: wine occasionally   . Drug use: No  . Sexual activity: Yes   Other Topics Concern  . Not on file   Social History Narrative  . No narrative on file     Constitutional: Denies fever, malaise, fatigue, headache or abrupt weight changes.  HEENT: Denies eye pain, eye redness, ear pain, ringing in the ears, wax buildup, runny nose, nasal congestion, bloody nose, or sore throat. Musculoskeletal: Pt reports neck pain. Denies decrease in range of motion, difficulty with gait, or joint pain and swelling.  Neurological: Denies dizziness, difficulty with memory,  difficulty with speech or problems with balance and coordination.    No other specific complaints in a complete review of systems (except as listed in HPI above).  Objective:   Physical Exam   BP 132/72   Pulse 70   Temp 98 F (36.7 C) (Oral)   Wt 212 lb 12 oz (96.5 kg)   SpO2 98%   BMI 35.95 kg/m  Wt Readings from Last 3 Encounters:  01/09/16 212 lb 12 oz (96.5 kg)  12/24/15 218 lb 2.7 oz (99 kg)  08/04/15 215 lb 12 oz (97.9 kg)    General: Appears her stated age, obese in NAD. Skin: Warm, dry and intact. No rashes, lesions or ulcerations noted. HEENT: Head: normal shape and size; Eyes: sclera white, no icterus, conjunctiva pink, PERRLA and EOMs intact;  Musculoskeletal: Normal flexion, rotation and lateral bending. Pain with extension. Pain with palpation over the right para cervical muscles extending into the right trapezius. Neurological: Alert and oriented. Cranial nerves II-XII grossly intact. Coordination normal.    BMET    Component Value Date/Time   NA 139 07/24/2010 0425   K 3.8 07/24/2010 0425   CL 103 07/24/2010 0425   CO2 30 07/24/2010 0425   GLUCOSE 114 (H) 07/24/2010 0425   BUN 11 07/24/2010 0425   CREATININE 0.58 07/24/2010 0425   CALCIUM 8.6 07/24/2010 0425   GFRNONAA >60 07/24/2010 0425   GFRAA  07/24/2010 0425    >60        The eGFR has been calculated using the MDRD equation. This calculation has not been validated in all clinical situations. eGFR's persistently <60 mL/min signify possible Chronic Kidney Disease.    Lipid Panel  No results found for: CHOL, TRIG, HDL, CHOLHDL, VLDL, LDLCALC  CBC    Component Value Date/Time   WBC 8.6 07/24/2010 0425   RBC 3.40 (L) 07/24/2010 0425   HGB 10.0 (L) 07/24/2010 0425   HCT 30.6 (L) 07/24/2010 0425   PLT 196 07/24/2010 0425   MCV 90.0 07/24/2010 0425   MCH 29.4 07/24/2010 0425   MCHC 32.7 07/24/2010 0425   RDW 13.4 07/24/2010 0425    Hgb A1C No results found for: HGBA1C           Assessment & Plan:   Neck pain:  Muscular in origin Continue Tylenol Add in stretching and heat  HTN:  Avoid NSAIDs and decongestants Continue Atenolol  Return precautions discussed Webb Silversmith, NP

## 2016-01-09 NOTE — Patient Instructions (Signed)
Back/Neck: How to set up your workstation  Key Points:  Chair:  An adjustable chair to fit your height with a slight downward tilt of chair seat is helpful.  Should have good high-back support that assists with keeping the natural curves of your spine.  A "lumbar roll" or pillow at your low back can help you keep good posture.  Adjustable arm rests may decrease strain in upper body.   Keyboard:  Should be close and at elbow or just below elbow height.  Split keyboards can help decrease strain on wrist.  Wrist supports can provide mini-breaks to your wrists throughout the day.  Monitor/Screen:  Top of screen should be at eye level or slightly lower.  Good lighting is important to prevent eye strain, or headaches from glare.  Head Posture: A copy holder on either side of the monitor will help limit neck strain.  Head should not be forward.  Ears should line up with you shoulders.  Leg Posture:  Knee and hips should be bent half-way (about a 90 degree angle).  Shorter people may need something to prop their feet on, with knees bent, like a phone book.  Other Stuff: Keep the stuff (phones, pens, phonebooks, etc) that you use frequently close to you, so you're not straining to reach them.  

## 2016-03-15 ENCOUNTER — Ambulatory Visit (INDEPENDENT_AMBULATORY_CARE_PROVIDER_SITE_OTHER): Payer: Medicare Other | Admitting: Family Medicine

## 2016-03-15 ENCOUNTER — Encounter: Payer: Self-pay | Admitting: Family Medicine

## 2016-03-15 VITALS — BP 120/70 | HR 82 | Temp 99.6°F | Ht 64.5 in | Wt 211.0 lb

## 2016-03-15 DIAGNOSIS — R6889 Other general symptoms and signs: Secondary | ICD-10-CM | POA: Diagnosis not present

## 2016-03-15 NOTE — Progress Notes (Signed)
Pre visit review using our clinic review tool, if applicable. No additional management support is needed unless otherwise documented below in the visit note. 

## 2016-03-15 NOTE — Progress Notes (Signed)
Dr. Frederico Hamman T. Roena Sassaman, MD, [facility] [place], [address] Phone: [phone] Fax: [phone]  03/15/2016  Patient: Stacy Tucker, MRN: [id number], DOB: 1950/04/03, 66 y.o.  Primary Physician:  Webb Silversmith, NP   Chief Complaint  Patient presents with  . Cough    with chest congestion with greenish/yellow phelgm  . Generalized Body Aches   Subjective:   Stacy Tucker presents with runny nose, sneezing, cough, sore throat, malaise, myalgias, arthralgia, chills, and fever. Last week, exposed to several people with flu, sore throat, ears, first time out of bed since the Saturday and breaking out in a cold sweats. No fever. Sweating a lot.   + recent exposure to others with similar symptoms.   The patent denies sore throat as the primary complaint. Denies sthortness of breath/wheezing, otalgia, facial pain, abdominal pain, changes in bowel or bladder.  Generally feels terrible  Tmax: 99  PMH, PHS, Allergies, Problem List, Medications, Family History, and Social History have all been reviewed.  Patient Active Problem List   Diagnosis Date Noted  . Depression 08/04/2015  . Hypothyroidism 08/04/2015  . Seasonal allergies 08/04/2015  . HTN (hypertension) 08/04/2015  . Hx of adenomatous polyp of colon 04/24/2014    Past Medical History:  Diagnosis Date  . Allergy    seasonal  . Cancer ([place])    skin cancer face, back   . Chicken pox   . Depression   . Endometriosis   . External hemorrhoid   . H/O bladder infections    as child/teen  . Hx of adenomatous polyp of colon 04/24/2014  . Hypertension   . Pneumonia   . Thyroid disease   . Vaginal delivery     Past Surgical History:  Procedure Laterality Date  . CARDIAC CATHETERIZATION     all normal   . COLONOSCOPY W/ POLYPECTOMY    . TOTAL KNEE ARTHROPLASTY Left 2012  . wisdom  teeth     no sedation with this, just novacaine    Social History    Social History  . Marital status: Married    Spouse name: N/A  . Number of children: N/A  . Years of education: N/A   Occupational History  . Not on file.   Social History Main Topics  . Smoking status: Former Smoker    Types: Cigarettes    Quit date: 12/23/2013  . Smokeless tobacco: Never Used     Comment: very infrequently take a puff of a cig, no whole cigarette   . Alcohol use 0.0 oz/week     Comment: wine occasionally   . Drug use: No  . Sexual activity: Yes   Other Topics Concern  . Not on file   Social History Narrative  . No narrative on file    Family History  Problem Relation Age of Onset  . Colon cancer Paternal Grandmother 98    no intervention  . Arthritis Paternal Grandmother   . Dementia Paternal Grandmother   . Arthritis Mother   . Heart disease Mother   . Hypertension Mother   . Depression Mother   . Dementia Mother   . Arthritis Father   . Alcohol abuse Paternal Grandfather   . Hypertension Brother   . Depression Brother   . Arthritis Daughter   . Breast cancer Maternal Aunt   . Hypertension Brother   . Depression Brother   . Esophageal cancer Neg Hx   . Rectal cancer Neg  Hx   . Stomach cancer Neg Hx     Allergies  Allergen Reactions  . Augmentin [Amoxicillin-Pot Clavulanate] Nausea And Vomiting  . Hydrocodone Other (See Comments)    Eyes would cross with this   . Influenza Vaccines Other (See Comments)    Fever, severe flu symptoms  . Lorazepam Hives  . Macrodantin [Nitrofurantoin Macrocrystal] Hives  . Penicillins Other (See Comments)    As child   . Pristiq [Desvenlafaxine] Other (See Comments)    Fever and legs stiff, couldn't ambulate   . Sulfa Antibiotics Rash    Medication list reviewed and updated in full in [place].  ROS as above, eating and drinking - tolerating PO. Urinating normally. No excessive vomitting or diarrhea. O/w as above.  Objective:   Blood pressure 120/70, pulse 82, temperature 99.6 F  (37.6 C), temperature source Oral, height 5' 4.5" (1.638 m), weight 211 lb (95.7 kg), SpO2 95 %.  Gen: WDWN, NAD; A & O x3, cooperative. Pleasant.Globally Non-toxic HEENT: Normocephalic and atraumatic. Throat clear, w/o exudate, R TM clear, L TM - good landmarks, No fluid present. rhinnorhea. No frontal or maxillary sinus T. MMM NECK: Anterior cervical  LAD is absent CV: RRR, No M/G/R, cap refill <2 sec PULM: Breathing comfortably in no respiratory distress. no wheezing, crackles, rhonchi ABD: S,NT,ND,+BS. No HSM. No rebound. EXT: No c/c/e PSYCH: Friendly, good eye contact MSK: Nml gait   Assessment and Plan:   Flu-like symptoms   > 48 hours, flu-like sx but no fever  Supportive care, fluids, cough medicines as needed, and anti-pyretics. Infection control emphasized, including OOW or school until AF 24 hours.  Follow-up: No Follow-up on file.  Signed,  Maud Deed. Eleanor Dimichele, MD   Patient's Medications  New Prescriptions   No medications on file  Previous Medications   ATENOLOL (TENORMIN) 25 MG TABLET    Take 25 mg by mouth daily.   CHOLECALCIFEROL (VITAMIN D) 1000 UNITS TABLET    Take 2,000 Units by mouth daily.   CITALOPRAM (CELEXA) 20 MG TABLET    Take 5 mg by mouth daily as needed.    ESTRADIOL (ESTRACE) 0.1 MG/GM VAGINAL CREAM    Place 1 Applicatorful vaginally once a week.   LEVOTHYROXINE (SYNTHROID, LEVOTHROID) 75 MCG TABLET    Take 75 mcg by mouth daily before breakfast.   OMEGA-3 FATTY ACIDS (FISH OIL) 1000 MG CAPS    Take 2,000 mg by mouth daily.   VALACYCLOVIR (VALTREX) 1000 MG TABLET    Take 1,000 mg by mouth as needed.   Modified Medications   No medications on file  Discontinued Medications   No medications on file

## 2016-04-30 ENCOUNTER — Telehealth: Payer: Self-pay | Admitting: Internal Medicine

## 2016-04-30 NOTE — Telephone Encounter (Signed)
Left pt message asking to call Allison back directly at [phone] to schedule AWV/CPE with PCP. °

## 2016-06-16 NOTE — Telephone Encounter (Signed)
Left pt message asking to call Allison back directly at [phone] to schedule AWV/CPE with PCP. °

## 2016-10-18 ENCOUNTER — Encounter: Payer: Self-pay | Admitting: Internal Medicine

## 2016-10-18 ENCOUNTER — Ambulatory Visit (INDEPENDENT_AMBULATORY_CARE_PROVIDER_SITE_OTHER): Payer: Medicare Other | Admitting: Internal Medicine

## 2016-10-18 ENCOUNTER — Encounter (INDEPENDENT_AMBULATORY_CARE_PROVIDER_SITE_OTHER): Payer: Self-pay

## 2016-10-18 VITALS — BP 122/76 | HR 72 | Ht 65.0 in | Wt 217.0 lb

## 2016-10-18 DIAGNOSIS — K644 Residual hemorrhoidal skin tags: Secondary | ICD-10-CM | POA: Diagnosis not present

## 2016-10-18 DIAGNOSIS — K641 Second degree hemorrhoids: Secondary | ICD-10-CM | POA: Diagnosis not present

## 2016-10-18 DIAGNOSIS — E669 Obesity, unspecified: Secondary | ICD-10-CM | POA: Insufficient documentation

## 2016-10-18 HISTORY — DX: Second degree hemorrhoids: K64.1

## 2016-10-18 HISTORY — DX: Residual hemorrhoidal skin tags: K64.4

## 2016-10-18 NOTE — Patient Instructions (Addendum)
  HEMORRHOID BANDING PROCEDURE    FOLLOW-UP CARE   1. The procedure you have had should have been relatively painless since the banding of the area involved does not have nerve endings and there is no pain sensation.  The rubber band cuts off the blood supply to the hemorrhoid and the band may fall off as soon as 48 hours after the banding (the band may occasionally be seen in the toilet bowl following a bowel movement). You may notice a temporary feeling of fullness in the rectum which should respond adequately to plain Tylenol or Motrin.  2. Following the banding, avoid strenuous exercise that evening and resume full activity the next day.  A sitz bath (soaking in a warm tub) or bidet is soothing, and can be useful for cleansing the area after bowel movements.     3. To avoid constipation, take two tablespoons of natural wheat bran, natural oat bran, flax, Benefiber or any over the counter fiber supplement and increase your water intake to 7-8 glasses daily.    4. Unless you have been prescribed anorectal medication, do not put anything inside your rectum for two weeks: No suppositories, enemas, fingers, etc.  5. Occasionally, you may have more bleeding than usual after the banding procedure.  This is often from the untreated hemorrhoids rather than the treated one.  Don't be concerned if there is a tablespoon or so of blood.  If there is more blood than this, lie flat with your bottom higher than your head and apply an ice pack to the area. If the bleeding does not stop within a half an hour or if you feel faint, call our office at [phone] or go to the emergency room.  6. Problems are not common; however, if there is a substantial amount of bleeding, severe pain, chills, fever or difficulty passing urine (very rare) or other problems, you should call us at [phone] or report to the nearest emergency room.  7. Do not stay seated continuously for more than 2-3 hours for a day or  two after the procedure.  Tighten your buttock muscles 10-15 times every two hours and take 10-15 deep breaths every 1-2 hours.  Do not spend more than a few minutes on the toilet if you cannot empty your bowel; instead re-visit the toilet at a later time.    Please visit the [facility] Website per Dr Carlean Purl.   Please follow up with Dr Carlean Purl on 11/22/2016 at 3:15pm    I appreciate the opportunity to care for you. Silvano Rusk, MD, [facility]

## 2016-10-18 NOTE — Assessment & Plan Note (Signed)
Right anterior columns banded repeat banding in September

## 2016-10-18 NOTE — Progress Notes (Signed)
Stacy Tucker 66 y.o. January 18, 1951 [id number] Assessment & Plan:   Encounter Diagnoses  Name Primary?  . Prolapsed internal hemorrhoids, grade 2 Yes  . Anal skin tags   . Obesity (BMI 30-39.9)    Hemorrhoidal banding initiated today see below she will return in September for repeat banding  We talked about weight loss, I suggested she consider looking at the diet Dr. website, try to go on a low-carb diet and avoid processed foods. CC: Velna Hatchet, MD   Subjective:   Chief Complaint:  HPI Chronic intermittent rectal bleeding - worseninf. 1-2 stools a day "blowout" No pain Some gas HC suppositories 2-3 x a year Colonoscopy February 2016 with a 6-7 mm adenoma, internal and external hemorrhoids with skin tags and diverticulosis  "I have a cruddy diet - I love cheese, red meat and bread"  Goes to mom and dad's in [place] 1 week a month Allergies  Allergen Reactions  . Augmentin [Amoxicillin-Pot Clavulanate] Nausea And Vomiting  . Hydrocodone Other (See Comments)    Eyes would cross with this   . Influenza Vaccines Other (See Comments)    Fever, severe flu symptoms  . Lorazepam Hives  . Macrodantin [Nitrofurantoin Macrocrystal] Hives  . Penicillins Other (See Comments)    As child   . Pristiq [Desvenlafaxine] Other (See Comments)    Fever and legs stiff, couldn't ambulate   . Sulfa Antibiotics Rash   Current Meds  Medication Sig  . atenolol (TENORMIN) 25 MG tablet Take 25 mg by mouth daily.  . cholecalciferol (VITAMIN D) 1000 units tablet Take 2,000 Units by mouth daily.  . citalopram (CELEXA) 20 MG tablet Take 5 mg by mouth daily as needed.   Marland Kitchen estradiol (ESTRACE) 0.1 MG/GM vaginal cream Place 1 Applicatorful vaginally once a week.  . levothyroxine (SYNTHROID, LEVOTHROID) 75 MCG tablet Take 75 mcg by mouth daily before breakfast.  . Omega-3 Fatty Acids (FISH OIL) 1000 MG CAPS Take 2,000 mg by mouth daily.  . valACYclovir (VALTREX) 1000 MG tablet Take  1,000 mg by mouth as needed.    Past Medical History:  Diagnosis Date  . Allergy    seasonal  . Arthritis    right knee- cortisone injections  . Cancer ([place])    skin cancer face, back   . Chicken pox   . Depression   . Endometriosis   . External hemorrhoid   . H/O bladder infections    as child/teen  . Hx of adenomatous polyp of colon 04/24/2014  . Hypertension   . Pneumonia   . Thyroid disease   . Vaginal delivery    Past Surgical History:  Procedure Laterality Date  . CARDIAC CATHETERIZATION     all normal   . COLONOSCOPY W/ POLYPECTOMY    . TOTAL KNEE ARTHROPLASTY Left 2012  . wisdom  teeth     no sedation with this, just novacaine      Review of Systems As above  Objective:   Physical Exam BP 122/76   Pulse 72   Ht 5\' 5"  (1.651 m)   Wt 217 lb (98.4 kg)   BMI 36.11 kg/m  Obese NAD  Patti Martinique, [place] present.  Rectal : large flesy RA and LL tags and small RP tag - nontender no mass, normal tone    Anoscopy was performed with the patient in the left lateral decubitus position while a chaperone was present and revealed Gr 2 internal hemorrhoids in all 3 positions   PROCEDURE NOTE: The patient  presents with symptomatic grade2  hemorrhoids, requesting rubber band ligation of his/her hemorrhoidal disease.  All risks, benefits and alternative forms of therapy were described and informed consent was obtained.   The anorectum was pre-medicated withTopical lidocaine and 0.125% nitroglycerin The decision was made to band the RA internal hemorrhoid, and the [place] was used to perform band ligation without complication.  Digital anorectal examination was then performed to assure proper positioning of the band, and to adjust the banded tissue as required.  The patient was discharged home without pain or other issues.  Dietary and behavioral recommendations were given and along with follow-up instructions.     The patient will return in 2-3 weeks for   follow-up and possible additional banding as required. No complications were encountered and the patient tolerated the procedure well.  I appreciate the opportunity to care for this patient. CC: Velna Hatchet, MD

## 2016-10-18 NOTE — Assessment & Plan Note (Signed)
These may shrink some with hemorrhoidal banding but would require surgical removal if she desires.

## 2016-11-22 ENCOUNTER — Encounter: Payer: Medicare Other | Admitting: Internal Medicine

## 2016-12-20 ENCOUNTER — Encounter: Payer: Medicare Other | Admitting: Internal Medicine

## 2016-12-27 ENCOUNTER — Telehealth: Payer: Self-pay | Admitting: *Deleted

## 2016-12-27 NOTE — Telephone Encounter (Signed)
Medical records received from [facility] for an upcoming on 12/30/16 at 3pm with Dr. Rosalyn Gess. Medical Records given to [place] on 12/27/16.  jjb

## 2016-12-30 ENCOUNTER — Ambulatory Visit: Payer: Medicare Other | Admitting: Cardiology

## 2017-01-21 ENCOUNTER — Encounter: Payer: Medicare Other | Admitting: Internal Medicine

## 2017-02-14 DIAGNOSIS — N809 Endometriosis, unspecified: Secondary | ICD-10-CM | POA: Insufficient documentation

## 2017-02-14 DIAGNOSIS — I10 Essential (primary) hypertension: Secondary | ICD-10-CM | POA: Insufficient documentation

## 2017-02-14 DIAGNOSIS — J189 Pneumonia, unspecified organism: Secondary | ICD-10-CM | POA: Insufficient documentation

## 2017-02-14 DIAGNOSIS — B019 Varicella without complication: Secondary | ICD-10-CM | POA: Insufficient documentation

## 2017-02-14 DIAGNOSIS — Z87440 Personal history of urinary (tract) infections: Secondary | ICD-10-CM | POA: Insufficient documentation

## 2017-02-14 DIAGNOSIS — T7840XA Allergy, unspecified, initial encounter: Secondary | ICD-10-CM | POA: Insufficient documentation

## 2017-02-14 DIAGNOSIS — M1990 Unspecified osteoarthritis, unspecified site: Secondary | ICD-10-CM | POA: Insufficient documentation

## 2017-02-14 DIAGNOSIS — C801 Malignant (primary) neoplasm, unspecified: Secondary | ICD-10-CM | POA: Insufficient documentation

## 2017-02-14 DIAGNOSIS — K644 Residual hemorrhoidal skin tags: Secondary | ICD-10-CM | POA: Insufficient documentation

## 2017-02-14 DIAGNOSIS — E079 Disorder of thyroid, unspecified: Secondary | ICD-10-CM | POA: Insufficient documentation

## 2017-02-18 ENCOUNTER — Ambulatory Visit: Payer: Medicare Other | Admitting: Cardiology

## 2017-03-11 ENCOUNTER — Encounter: Payer: Medicare Other | Admitting: Internal Medicine

## 2017-03-18 ENCOUNTER — Ambulatory Visit: Payer: Medicare Other | Admitting: Cardiology

## 2017-09-02 ENCOUNTER — Ambulatory Visit: Payer: Medicare Other | Admitting: Cardiovascular Disease

## 2017-10-18 ENCOUNTER — Other Ambulatory Visit: Payer: Self-pay | Admitting: Obstetrics and Gynecology

## 2017-10-18 DIAGNOSIS — R928 Other abnormal and inconclusive findings on diagnostic imaging of breast: Secondary | ICD-10-CM

## 2017-10-28 ENCOUNTER — Ambulatory Visit
Admission: RE | Admit: 2017-10-28 | Discharge: 2017-10-28 | Disposition: A | Discharge status: Home / Self Care | Payer: Medicare Other | Source: Ambulatory Visit | Attending: Obstetrics and Gynecology | Admitting: Obstetrics and Gynecology

## 2017-10-28 ENCOUNTER — Ambulatory Visit: Payer: Medicare Other | Admitting: Cardiovascular Disease

## 2017-10-28 ENCOUNTER — Other Ambulatory Visit: Payer: Self-pay | Admitting: Obstetrics and Gynecology

## 2017-10-28 DIAGNOSIS — R928 Other abnormal and inconclusive findings on diagnostic imaging of breast: Secondary | ICD-10-CM

## 2017-10-28 DIAGNOSIS — N6489 Other specified disorders of breast: Secondary | ICD-10-CM

## 2017-12-16 ENCOUNTER — Telehealth: Payer: Self-pay

## 2017-12-16 NOTE — Telephone Encounter (Signed)
12/23/17 appointment with Dr. Loletha Grayer canceled, sent notes to medical records.

## 2017-12-20 ENCOUNTER — Telehealth: Payer: Self-pay | Admitting: Cardiovascular Disease

## 2017-12-23 ENCOUNTER — Ambulatory Visit: Payer: Medicare Other | Admitting: Cardiovascular Disease

## 2017-12-23 NOTE — Telephone Encounter (Signed)
Error no note needed °

## 2018-01-27 ENCOUNTER — Ambulatory Visit: Payer: Medicare Other | Admitting: Internal Medicine

## 2018-02-24 ENCOUNTER — Ambulatory Visit: Payer: Medicare Other | Admitting: Internal Medicine

## 2018-03-28 ENCOUNTER — Ambulatory Visit: Payer: Medicare Other | Admitting: Internal Medicine

## 2018-03-31 ENCOUNTER — Ambulatory Visit: Payer: Medicare Other | Admitting: Internal Medicine

## 2018-05-01 ENCOUNTER — Other Ambulatory Visit: Payer: Medicare Other

## 2018-05-08 ENCOUNTER — Ambulatory Visit
Admission: RE | Admit: 2018-05-08 | Discharge: 2018-05-08 | Disposition: A | Discharge status: Home / Self Care | Payer: Medicare Other | Source: Ambulatory Visit | Attending: Obstetrics and Gynecology | Admitting: Obstetrics and Gynecology

## 2018-05-08 ENCOUNTER — Other Ambulatory Visit: Payer: Self-pay

## 2018-05-08 ENCOUNTER — Other Ambulatory Visit: Payer: Self-pay | Admitting: Obstetrics and Gynecology

## 2018-05-08 ENCOUNTER — Ambulatory Visit: Payer: Medicare Other

## 2018-05-08 DIAGNOSIS — N6489 Other specified disorders of breast: Secondary | ICD-10-CM

## 2018-06-05 ENCOUNTER — Telehealth: Payer: Self-pay | Admitting: Internal Medicine

## 2018-06-05 NOTE — Telephone Encounter (Signed)
We can send her an Rx for 2.5% hydrocortisone cream # 30 g 1 refill  I do want you to ask exactly what symptoms are - pain, bleeding, other?  If she is in  pain then probably has a thrombosed hemorrhoid and warm soaks, etc would be appropriate  We can do an in office visit if necessary

## 2018-06-05 NOTE — Telephone Encounter (Signed)
Pt said she has a Hemorid that is bothering her very bad. Would like to know if she can get something called in or what she can do since we are not scheduling any Banding appt.

## 2018-06-05 NOTE — Telephone Encounter (Signed)
Dr. Carlean Purl are you willing to send in rx for her hemorrhoids?

## 2018-06-06 MED ORDER — HYDROCORTISONE (PERIANAL) 2.5 % EX CREA: 1.0000 "application " | TOPICAL_CREAM | Freq: Two times a day (BID) | CUTANEOUS | 1 refills | Status: DC

## 2018-06-06 NOTE — Telephone Encounter (Signed)
Patient reports external hemorrhoid with irritation and burning.  She denies thrombosed symptoms.  Rx sent.  She will call back if she has continued concerns and we will do a webex/telehealth visit.

## 2018-08-01 ENCOUNTER — Other Ambulatory Visit: Payer: Self-pay | Admitting: Internal Medicine

## 2018-08-01 NOTE — Telephone Encounter (Signed)
I spoke with Stacy Tucker and she is still having problems and request a refill and an appointment. 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 We will work her in 08/14/2018 at 11:30AM. She is aware we will call her to [place] screen her before the appointment.

## 2018-08-11 ENCOUNTER — Telehealth: Payer: Self-pay | Admitting: General Surgery

## 2018-08-11 NOTE — Telephone Encounter (Signed)
Covid-19 screening questions   Do you now or have you had a fever in the last 14 days?NO  Do you have any respiratory symptoms of shortness of breath or cough now or in the last 14 days? NO  Do you have any family members or close contacts with diagnosed or suspected Covid-19 in the past 14 days? NO  Have you been tested for Covid-19 and found to be positive? NO  Patient advised to wear a mask to her office visit. Patient verbalized understanding.

## 2018-08-14 ENCOUNTER — Ambulatory Visit (INDEPENDENT_AMBULATORY_CARE_PROVIDER_SITE_OTHER): Payer: Medicare Other | Admitting: Internal Medicine

## 2018-08-14 ENCOUNTER — Encounter: Payer: Self-pay | Admitting: Internal Medicine

## 2018-08-14 DIAGNOSIS — K641 Second degree hemorrhoids: Secondary | ICD-10-CM | POA: Diagnosis not present

## 2018-08-14 NOTE — Patient Instructions (Addendum)
Call back in August if yours symptoms have not improved.   HEMORRHOID BANDING PROCEDURE    FOLLOW-UP CARE   1. The procedure you have had should have been relatively painless since the banding of the area involved does not have nerve endings and there is no pain sensation.  The rubber band cuts off the blood supply to the hemorrhoid and the band may fall off as soon as 48 hours after the banding (the band may occasionally be seen in the toilet bowl following a bowel movement). You may notice a temporary feeling of fullness in the rectum which should respond adequately to plain Tylenol or Motrin.  2. Following the banding, avoid strenuous exercise that evening and resume full activity the next day.  A sitz bath (soaking in a warm tub) or bidet is soothing, and can be useful for cleansing the area after bowel movements.     3. To avoid constipation, take two tablespoons of natural wheat bran, natural oat bran, flax, Benefiber or any over the counter fiber supplement and increase your water intake to 7-8 glasses daily.    4. Unless you have been prescribed anorectal medication, do not put anything inside your rectum for two weeks: No suppositories, enemas, fingers, etc.  5. Occasionally, you may have more bleeding than usual after the banding procedure.  This is often from the untreated hemorrhoids rather than the treated one.  Don't be concerned if there is a tablespoon or so of blood.  If there is more blood than this, lie flat with your bottom higher than your head and apply an ice pack to the area. If the bleeding does not stop within a half an hour or if you feel faint, call our office at [phone] or go to the emergency room.  6. Problems are not common; however, if there is a substantial amount of bleeding, severe pain, chills, fever or difficulty passing urine (very rare) or other problems, you should call us at [phone] or report to the nearest emergency room.  7. Do not  stay seated continuously for more than 2-3 hours for a day or two after the procedure.  Tighten your buttock muscles 10-15 times every two hours and take 10-15 deep breaths every 1-2 hours.  Do not spend more than a few minutes on the toilet if you cannot empty your bowel; instead re-visit the toilet at a later time.    I appreciate the opportunity to care for you. Silvano Rusk, MD, [facility]

## 2018-08-14 NOTE — Assessment & Plan Note (Signed)
RP and LL

## 2018-08-14 NOTE — Progress Notes (Signed)
    HEMORRHOID LIGATION  Sxs Bleeding prolapse  She has been losing weight on a Mehdi fast and then low-carb diets, she notes that probiotics when in the supplements or when taken separately lead to diarrhea and irritation of her hemorrhoids  Previous ligation of right anterior internal hemorrhoid in 2018.  Recent hx c/w thrombosed hemorroid  Harless Nakayama, CMA present.  Rectal - mod fleshy tags RA, LL  RP small papule - resolving thrombosed hemorrhoid  Anoscopy examination demonstrates grade 2 left lateral and right posterior internal hemorrhoids grade 1 right anterior.    PROCEDURE NOTE: The patient presents with symptomatic grade grade 2 hemorrhoids, requesting rubber band ligation of his/her hemorrhoidal disease.  All risks, benefits and alternative forms of therapy were described and informed consent was obtained.   The anorectum was pre-medicated with 0.125% nitroglycerin and 5% lidocaine topically The decision was made to band the right posterior and left lateral internal hemorrhoids, and the [place] was used to perform band ligation without complication.  Digital anorectal examination was then performed to assure proper positioning of the band, and to adjust the banded tissue as required.  The patient was discharged home without pain or other issues.  Dietary and behavioral recommendations were given and along with follow-up instructions.       The patient will return in as needed for  follow-up and possible additional banding as required. No complications were encountered and the patient tolerated the procedure well. She was advised to contact us in August if she was not satisfied with the response to the banding.   CC: Velna Hatchet, MD

## 2018-08-21 ENCOUNTER — Telehealth: Payer: Self-pay | Admitting: Internal Medicine

## 2018-08-21 NOTE — Telephone Encounter (Signed)
Patient advised that we do not have the August schedule out.  She is asked to call back a week or two.

## 2018-08-21 NOTE — Telephone Encounter (Signed)
Patient called in wanting to schedule a banding procedure. She is looking to schedule 10/09/2018. Please call thanks.

## 2018-10-19 ENCOUNTER — Other Ambulatory Visit: Payer: Self-pay | Admitting: Internal Medicine

## 2018-10-19 DIAGNOSIS — Z87891 Personal history of nicotine dependence: Secondary | ICD-10-CM

## 2018-10-19 DIAGNOSIS — Z0000 Encounter for general adult medical examination without abnormal findings: Secondary | ICD-10-CM

## 2018-11-06 ENCOUNTER — Ambulatory Visit
Admission: RE | Admit: 2018-11-06 | Discharge: 2018-11-06 | Disposition: A | Discharge status: Home / Self Care | Payer: Medicare Other | Source: Ambulatory Visit | Attending: Obstetrics and Gynecology | Admitting: Obstetrics and Gynecology

## 2018-11-06 ENCOUNTER — Ambulatory Visit
Admission: RE | Admit: 2018-11-06 | Discharge: 2018-11-06 | Disposition: A | Discharge status: Home / Self Care | Payer: Medicare Other | Source: Ambulatory Visit | Attending: Internal Medicine | Admitting: Internal Medicine

## 2018-11-06 ENCOUNTER — Other Ambulatory Visit: Payer: Self-pay

## 2018-11-06 DIAGNOSIS — N6489 Other specified disorders of breast: Secondary | ICD-10-CM

## 2018-11-06 DIAGNOSIS — Z0000 Encounter for general adult medical examination without abnormal findings: Secondary | ICD-10-CM

## 2018-11-06 DIAGNOSIS — Z87891 Personal history of nicotine dependence: Secondary | ICD-10-CM

## 2019-07-26 ENCOUNTER — Emergency Department (HOSPITAL_COMMUNITY): Payer: Medicare PPO

## 2019-07-26 ENCOUNTER — Other Ambulatory Visit: Payer: Self-pay

## 2019-07-26 ENCOUNTER — Encounter (HOSPITAL_COMMUNITY): Payer: Self-pay

## 2019-07-26 ENCOUNTER — Emergency Department (HOSPITAL_COMMUNITY)
Admission: EM | Admit: 2019-07-26 | Discharge: 2019-07-26 | Disposition: A | Discharge status: Home / Self Care | Payer: Medicare PPO | Attending: Emergency Medicine | Admitting: Emergency Medicine

## 2019-07-26 DIAGNOSIS — E039 Hypothyroidism, unspecified: Secondary | ICD-10-CM | POA: Diagnosis not present

## 2019-07-26 DIAGNOSIS — Z87891 Personal history of nicotine dependence: Secondary | ICD-10-CM | POA: Insufficient documentation

## 2019-07-26 DIAGNOSIS — Z85828 Personal history of other malignant neoplasm of skin: Secondary | ICD-10-CM | POA: Insufficient documentation

## 2019-07-26 DIAGNOSIS — I10 Essential (primary) hypertension: Secondary | ICD-10-CM | POA: Diagnosis not present

## 2019-07-26 DIAGNOSIS — Z79899 Other long term (current) drug therapy: Secondary | ICD-10-CM | POA: Insufficient documentation

## 2019-07-26 DIAGNOSIS — R079 Chest pain, unspecified: Secondary | ICD-10-CM | POA: Diagnosis present

## 2019-07-26 DIAGNOSIS — Z96652 Presence of left artificial knee joint: Secondary | ICD-10-CM | POA: Diagnosis not present

## 2019-07-26 DIAGNOSIS — M79602 Pain in left arm: Secondary | ICD-10-CM | POA: Diagnosis not present

## 2019-07-26 DIAGNOSIS — R0789 Other chest pain: Secondary | ICD-10-CM | POA: Diagnosis not present

## 2019-07-26 LAB — CBC
HCT: 41 % (ref 36.0–46.0)
Hemoglobin: 13.5 g/dL (ref 12.0–15.0)
MCH: 30.5 pg (ref 26.0–34.0)
MCHC: 32.9 g/dL (ref 30.0–36.0)
MCV: 92.8 fL (ref 80.0–100.0)
Platelets: 264 10*3/uL (ref 150–400)
RBC: 4.42 MIL/uL (ref 3.87–5.11)
RDW: 12.5 % (ref 11.5–15.5)
WBC: 7.4 10*3/uL (ref 4.0–10.5)
nRBC: 0 % (ref 0.0–0.2)

## 2019-07-26 LAB — BASIC METABOLIC PANEL
Anion gap: 10 (ref 5–15)
BUN: 14 mg/dL (ref 8–23)
CO2: 24 mmol/L (ref 22–32)
Calcium: 9.2 mg/dL (ref 8.9–10.3)
Chloride: 103 mmol/L (ref 98–111)
Creatinine, Ser: 0.75 mg/dL (ref 0.44–1.00)
GFR calc Af Amer: >60 mL/min (ref >60)
GFR calc non Af Amer: >60 mL/min (ref >60)
Glucose, Bld: 114 mg/dL — ABNORMAL HIGH (ref 70–99)
Potassium: 4.6 mmol/L (ref 3.5–5.1)
Sodium: 137 mmol/L (ref 135–145)

## 2019-07-26 LAB — TROPONIN I (HIGH SENSITIVITY)
Troponin I (High Sensitivity): <2 ng/L (ref <18)
Troponin I (High Sensitivity): <2 ng/L (ref <18)

## 2019-07-26 NOTE — ED Notes (Signed)
Pt verbalized understanding of discharge instructions. Follow up care and pain management reviewed, pt had no further questions. Ambulated independently to lobby w/ daughter.
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
## 2019-09-13 DIAGNOSIS — F4320 Adjustment disorder, unspecified: Secondary | ICD-10-CM | POA: Diagnosis not present

## 2019-09-13 DIAGNOSIS — E669 Obesity, unspecified: Secondary | ICD-10-CM | POA: Diagnosis not present

## 2019-09-13 DIAGNOSIS — R0609 Other forms of dyspnea: Secondary | ICD-10-CM | POA: Diagnosis not present

## 2019-09-13 DIAGNOSIS — W57XXXA Bitten or stung by nonvenomous insect and other nonvenomous arthropods, initial encounter: Secondary | ICD-10-CM | POA: Diagnosis not present

## 2019-09-13 DIAGNOSIS — R509 Fever, unspecified: Secondary | ICD-10-CM | POA: Diagnosis not present

## 2019-09-13 DIAGNOSIS — F330 Major depressive disorder, recurrent, mild: Secondary | ICD-10-CM | POA: Diagnosis not present

## 2019-09-13 DIAGNOSIS — M791 Myalgia, unspecified site: Secondary | ICD-10-CM | POA: Diagnosis not present

## 2019-09-13 DIAGNOSIS — L509 Urticaria, unspecified: Secondary | ICD-10-CM | POA: Diagnosis not present

## 2019-09-13 DIAGNOSIS — I10 Essential (primary) hypertension: Secondary | ICD-10-CM | POA: Diagnosis not present

## 2019-10-08 ENCOUNTER — Other Ambulatory Visit: Payer: Self-pay

## 2019-10-08 ENCOUNTER — Ambulatory Visit (INDEPENDENT_AMBULATORY_CARE_PROVIDER_SITE_OTHER): Payer: Medicare PPO | Admitting: Licensed Clinical Social Worker

## 2019-10-08 DIAGNOSIS — F330 Major depressive disorder, recurrent, mild: Secondary | ICD-10-CM

## 2019-10-08 DIAGNOSIS — F411 Generalized anxiety disorder: Secondary | ICD-10-CM | POA: Diagnosis not present

## 2019-10-08 DIAGNOSIS — F5081 Binge eating disorder: Secondary | ICD-10-CM | POA: Diagnosis not present
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
## 2019-10-08 NOTE — Patient Instructions (Signed)
Caring for Your Mental Health Mental health is emotional, psychological, and social well-being. Mental health is just as important as physical health. In fact, mental and physical health are connected, and you need both to be healthy. Some signs of good mental health (well-being) include:  Being able to attend to tasks at home, school, or work.  Being able to manage stress and emotions.  Practicing self-care, which may include: ? A regular exercise pattern. ? A reasonably healthy diet. ? Supportive and trusting relationships. ? The ability to relax and calm yourself (self-calm).  Having pleasurable hobbies and activities to do.  Believing that you have meaning and purpose in your life.  Recovering and adjusting after facing challenges (resilience). You can take steps to build or strengthen these mentally healthy behaviors. There are resources and support to help you with this. Why is caring for mental health important? Caring for your mental health is a big part of staying healthy. Everyone has times when feelings, thoughts, or situations feel overwhelming. Mental health means having the skills to manage what feels overwhelming. If this sense of being overwhelmed persists, however, you might need some help. If you have some of the following signs, you may need to take better care of your mental health or seek help from a health care provider or mental health professional:  Problems with energy or focus.  Changes in eating habits.  Problems sleeping, such as sleeping too much or not enough.  Emotional distress, such as anger, sadness, depression, or anxiety.  Major changes in your relationships.  Losing interest in life or activities that you used to enjoy. If you have any of these symptoms on most days for 2 weeks or longer:  Talk with a close friend or family member about how you are feeling.  Contact your health care provider to discuss your symptoms.  Consider working with a  Education officer, community. Your health care provider, family, or friends may be able to recommend a therapist. What can I do to promote emotional and mental health? Managing emotions  Learn to identify emotions and deal with them. Recognizing your emotions is the first step in learning to deal with them.  Practice ways to appropriately express feelings. Remember that you can control your feelings. They do not control you.  Practice stress management techniques, such as: ? Relaxation techniques, like breathing or muscle relaxation exercises. ? Exercise. Regular activity can lower your stress level. ? Changing what you can change and accepting what you cannot change.  Build up your resilience so that you can recover and adjust after big problems or challenges. Practice resilient behaviors and attitudes: ? Set and focus on long-term goals. ? Develop and maintain healthy, supportive relationships. ? Learn to accept change and make the best of the situation. ? Take care of yourself physically by eating a healthy diet, getting plenty of sleep, and exercising regularly. ? Develop self-awareness. Ask others to give feedback about how they see you. ? Practice mindfulness meditation to help you stay calm when dealing with daily challenges. ? Learn to respond to situations in healthy ways, rather than reacting with your emotions. ? Keep a positive attitude, and believe in yourself. Your view of yourself affects your mental health. ? Develop your listening and empathy skills. These will help you deal with difficult situations and communications.  Remember that emotions can be used as a good source of communication and are a great source of energy. Try to laugh and find humor in life.  Sleeping  Get the right amount and quality of sleep. Sleep has a big impact on physical and mental health. To improve your sleep: ? Go to bed and wake up around the same time every day. ? Limit screen time before  bedtime. This includes the use of your cell phone, TV, computer, and tablet. ? Keep your bedroom dark and cool. Activity   Exercise or do some physical activity regularly. This helps: ? Keep your body strong, especially during times of stress. ? Get rid of chemicals in your body (hormones) that build up when you are stressed. ? Build up your resilience. Eating and drinking   Eat a healthy diet that includes whole grains, vegetables, fresh fruits, and lean proteins. If you have questions about what foods are best for you, ask your health care provider.  Try not to turn to sweet, salty, or otherwise unhealthy foods when you are tired or unhappy. This can lead to unwanted weight gain and is not a healthy way to cope with emotions. Where to find more information You can find more information about how to care for your mental health from:  [employer] on Mental Illness (NAMI): www.nami.[place]: https://carter.com/  Centers for Disease Control and Prevention: RapLives.dk Contact a health care provider if:  You lose interest in being with others or you do not want to leave the house.  You have a hard time completing your normal activities or you have less energy than normal.  You cannot stay focused or you have problems with memory.  You feel that your senses are heightened, and this makes you upset or concerned.  You feel nervous or have rapid mood changes.  You are sleeping or eating more or less than normal.  You question reality or you show odd behavior that disturbs you or others. Get help right away if:  You have thoughts about hurting yourself or others. If you ever feel like you may hurt yourself or others, or have thoughts about taking your own life, get help right away. You can go to your nearest emergency department or call:  Your local emergency services (911 in the [place]  A suicide crisis helpline, such as the  [place] at 939 104 3916. This is open 24 hours a day. Summary  Mental health is not just the absence of mental illness. It involves understanding your emotions and behaviors, and taking steps to cope with them in a healthy way.  If you have symptoms of mental or emotional distress, get help from family, friends, a health care provider, or a mental health professional.  Practice good mental health behaviors such as stress management skills, self-calming skills, exercise, and healthy sleeping and eating. This information is not intended to replace advice given to you by your health care provider. Make sure you discuss any questions you have with your health care provider. Document Revised: 01/21/2017 Document Reviewed: 06/22/2016 Elsevier Patient Education  [place] Disorder Binge-eating disorder is a problem that involves repeated episodes of binge-eating. Binge-eating refers to eating a larger-than-normal amount of food in a short period of time, usually within 2 hours. People with this condition may eat even when they are not hungry, and they do not stop eating even when they feel full. People with binge-eating disorder feel unable to control their eating. Although they feel bad about overeating, they usually do not try to undo the bingeing by using laxatives or making themselves vomit. They  do not starve themselves or exercise too much. Binge-eating disorder usually starts in the teenage years or early 66s. It often gets worse with stress. What are the causes? The cause of this condition is not known. What increases the risk? The following factors may make you more likely to develop this condition:  Being a teenager or in your early 31s.  Being female. Binge-eating disorder can affect males, but it is more common in females.  Being overweight or obese.  Having a mental health disorder, such as depression or anxiety.  Having a  substance use disorder, such as alcohol use disorder.  Having a history of unhealthy dieting, such as meal skipping, yo-yo dieting, food restricting, or avoiding certain kinds of foods. What are the signs or symptoms? Symptoms of this condition include:  Eating much more quickly than normal.  Eating to the point of feeling physically uncomfortable.  Eating large amounts of food when you are not hungry.  Eating alone because you are embarrassed about how much you are eating.  Feeling disgusted, depressed, or guilty after overeating. How is this diagnosed? This condition is diagnosed through an assessment by your health care provider. You may be diagnosed with the disorder if you:  Binge-eat an average of one or more times a week for three months or longer.  Have three or more of the symptoms of the disorder. Once you have been diagnosed, your level of binge-eating disorder will be rated from mild to severe. The rating is based on how often you binge-eat. How is this treated? This condition may be treated with:  Cognitive behavioral therapy (CBT). This is a form of talk therapy that helps you recognize the thoughts, beliefs, and emotions that contribute to overeating. It also helps you change them.  Interpersonal psychotherapy. This is a form of talk therapy that focuses on fixing relationship problems that trigger binge-eating episodes.  Dialectical behavioral therapy (DBT). This is a form of talk therapy that helps you learn skills to control your emotions and tolerate distress without binge-eating.  Medicine.  Weight-loss programs. These can be important if you are overweight. Losing excess weight can improve your physical health and the way you feel about yourself. Treatment is usually provided by mental health professionals, such as psychologists, psychiatrists, licensed professional counselors, and clinical social workers. Follow these instructions at home: Lifestyle       Eat a healthy diet that consists of lean meats and low-fat dairy products, as well as foods that are high in fiber, such as fresh fruits and vegetables, whole grains, and beans.  Work to develop a healthy relationship with food. Talk with your health care provider or a nutrition specialist (dietitian). He or she can provide guidance about healthy eating and healthy lifestyle choices.  Start an exercise routine and stay active. Aim for 30 or more minutes of exercise a day on 5 or more days a week to keep your body strong and healthy. You may need to exercise more if you want to lose weight. Talk with your health care provider about how much and what type of exercise you can do. Some ways to be active include: ? Playing sports. ? Biking. ? Skating or skateboarding. ? Dancing. ? Running, walking, jogging, or hiking. ? Doing yard work. General instructions  Take over-the-counter and prescription medicines only as told by your health care provider.  Drink enough fluid to keep your urine pale yellow.  Keep all follow-up visits as told by your health care provider.  This is important. Where to find more information National Eating Disorders Association (NEDA): www.nationaleatingdisorders.org Contact a health care provider if:  Your symptoms get worse.  You start having new symptoms.  You start compensating for eating binges with harmful behaviors, such as: ? Making yourself vomit. ? Exercising too much. ? Using laxatives. Get help right away if:  You have serious thoughts about hurting yourself or someone else. If you ever feel like you may hurt yourself or others, or have thoughts about taking your own life, get help right away. You can go to your nearest emergency department or call:  Your local emergency services (911 in the [place]  A suicide crisis helpline, such as the [place] at 260-556-0024. This is open 24 hours a day. Summary  You may have  binge-eating disorder if you have feelings of guilt from overeating, eat to the point of feeling uncomfortable, eat a large amount of food in a short time, or find yourself eating when you are not hungry. Seek help from your health care provider.  The exact cause of a binge-eating disorder is not known. There are some risk factors for this disease, such as having a mental health disorder and having a history of unhealthy dieting.  There are a variety of treatment options such as counseling therapy, medicines, and learning healthy ways to lose or maintain your weight. These can help you overcome your binge-eating disorder. This information is not intended to replace advice given to you by your health care provider. Make sure you discuss any questions you have with your health care provider. Document Revised: 05/31/2018 Document Reviewed: 11/08/2016 Elsevier Patient Education  [place].

## 2019-10-12 DIAGNOSIS — E039 Hypothyroidism, unspecified: Secondary | ICD-10-CM | POA: Diagnosis not present

## 2019-10-12 DIAGNOSIS — I10 Essential (primary) hypertension: Secondary | ICD-10-CM | POA: Diagnosis not present

## 2019-10-12 DIAGNOSIS — E559 Vitamin D deficiency, unspecified: Secondary | ICD-10-CM | POA: Diagnosis not present

## 2019-10-19 DIAGNOSIS — E039 Hypothyroidism, unspecified: Secondary | ICD-10-CM | POA: Diagnosis not present

## 2019-10-19 DIAGNOSIS — F419 Anxiety disorder, unspecified: Secondary | ICD-10-CM | POA: Diagnosis not present

## 2019-10-19 DIAGNOSIS — R82998 Other abnormal findings in urine: Secondary | ICD-10-CM | POA: Diagnosis not present

## 2019-10-19 DIAGNOSIS — F330 Major depressive disorder, recurrent, mild: Secondary | ICD-10-CM | POA: Diagnosis not present

## 2019-10-19 DIAGNOSIS — R739 Hyperglycemia, unspecified: Secondary | ICD-10-CM | POA: Diagnosis not present

## 2019-10-19 DIAGNOSIS — E669 Obesity, unspecified: Secondary | ICD-10-CM | POA: Diagnosis not present

## 2019-10-19 DIAGNOSIS — I10 Essential (primary) hypertension: Secondary | ICD-10-CM | POA: Diagnosis not present

## 2019-10-19 DIAGNOSIS — Z1212 Encounter for screening for malignant neoplasm of rectum: Secondary | ICD-10-CM | POA: Diagnosis not present

## 2019-10-19 DIAGNOSIS — Z0000 Encounter for general adult medical examination without abnormal findings: Secondary | ICD-10-CM | POA: Diagnosis not present

## 2019-11-05 ENCOUNTER — Ambulatory Visit: Payer: Medicare PPO | Admitting: Licensed Clinical Social Worker

## 2019-11-13 ENCOUNTER — Other Ambulatory Visit: Payer: Self-pay

## 2019-11-13 ENCOUNTER — Ambulatory Visit (INDEPENDENT_AMBULATORY_CARE_PROVIDER_SITE_OTHER): Payer: Medicare PPO | Admitting: Licensed Clinical Social Worker

## 2019-11-13 DIAGNOSIS — F330 Major depressive disorder, recurrent, mild: Secondary | ICD-10-CM | POA: Diagnosis not present

## 2019-11-13 DIAGNOSIS — F5081 Binge eating disorder, mild: Secondary | ICD-10-CM

## 2019-11-13 DIAGNOSIS — F411 Generalized anxiety disorder: Secondary | ICD-10-CM

## 2019-11-13 NOTE — Progress Notes (Signed)
Virtual Visit via Video Note  I connected with Stacy Tucker on 11/13/19 at  9:00 AM EDT by a video enabled telemedicine application and verified that I am speaking with the correct person using two identifiers.  Location: Patient: home Provider: [facility]   I discussed the limitations of evaluation and management by telemedicine and the availability of in person appointments. The patient expressed understanding and agreed to proceed.  I discussed the assessment and treatment plan with the patient. The patient was provided an opportunity to ask questions and all were answered. The patient agreed with the plan and demonstrated an understanding of the instructions.   The patient was advised to call back or seek an in-person evaluation if the symptoms worsen or if the condition fails to improve as anticipated.  I provided 45 minutes of non-face-to-face time during this encounter.   Miriam Liles R Kinsey Cowsert, LCSW    THERAPIST PROGRESS NOTE  Session Time: 9:00-9:45a  Participation Level: Active  Behavioral Response: Neat and Well GroomedAlertAnxious  Type of Therapy: Individual Therapy  Treatment Goals addressed: Anxiety and Coping  Interventions: CBT and Supportive  Summary: Stacy Tucker is a 69 y.o. female who presents with improving symptoms related to her diagnoses (depression, anxiety, binge eating disorder). Pt reports that she is currently using the "[employer]" program, which is teaching her about eating behaviors and nutrition. Pt is tracking foods that she eats and has lost 3 lbs.  Explored pts feelings about losing dog recently--pt also got new puppy. Pt took puppy with her on vacation--had a great time.   Pt is happy where she is, mood wise and feels she is managing her anxiety well.  Encouraged pt to continue self-care activities and focus on life balance.   EAT-26 score 18  Suicidal/Homicidal: No  SI, HI, or AVH reported at time of session.  Therapist Response:  Stacy Tucker reports a decrease in mood-related symptoms and a decrease in anxiety symptoms since last session. Pt reports healthier eating patterns, and a three pound weight loss. This is reflective of overall progress.   Plan: Return again in 4 weeks. Ongoing treatment plan to include mood management, anxiety management, and eating behavior modification.   Diagnosis: Axis I: Generalized Anxiety Disorder, Major Depression, Recurrent mild and eating disorder    Axis II: No diagnosis    Stacy Bo Ayaansh Smail, LCSW 11/13/2019

## 2019-11-19 DIAGNOSIS — M1711 Unilateral primary osteoarthritis, right knee: Secondary | ICD-10-CM | POA: Diagnosis not present

## 2019-12-13 ENCOUNTER — Other Ambulatory Visit: Payer: Self-pay

## 2019-12-13 ENCOUNTER — Ambulatory Visit (INDEPENDENT_AMBULATORY_CARE_PROVIDER_SITE_OTHER): Payer: Medicare PPO | Admitting: Licensed Clinical Social Worker

## 2019-12-13 DIAGNOSIS — F411 Generalized anxiety disorder: Secondary | ICD-10-CM

## 2019-12-13 DIAGNOSIS — F330 Major depressive disorder, recurrent, mild: Secondary | ICD-10-CM

## 2019-12-13 DIAGNOSIS — F5081 Binge eating disorder: Secondary | ICD-10-CM | POA: Diagnosis not present

## 2019-12-13 NOTE — Progress Notes (Signed)
Virtual Visit via Video Note  I connected with [name] on 12/13/19 at  9:00 AM EDT by a video enabled telemedicine application and verified that I am speaking with the correct person using two identifiers.  Location: Patient: home Provider: remote office [place], [place])   I discussed the limitations of evaluation and management by telemedicine and the availability of in person appointments. The patient expressed understanding and agreed to proceed.  The patient was advised to call back or seek an in-person evaluation if the symptoms worsen or if the condition fails to improve as anticipated.  I provided 25 minutes of non-face-to-face time during this encounter.   Elman Dettman R Jailani Hogans, LCSW    THERAPIST PROGRESS NOTE  Session Time: 9:00-9:25a  Participation Level: Active  Behavioral Response: NeatAlertgenerally pleasant and positive  Type of Therapy: Individual Therapy  Treatment Goals addressed: Coping  Interventions: CBT, Solution Focused and Supportive  Summary: Stacy Tucker is a 69 y.o. female who presents with improving symptoms related to anxiety, depression, and eating-related disorders. Pt reports medication compliance and good quality and quantity of sleep.  Allowed pt to explore and express thoughts and feelings about overall life management. Pt feels that she is being intentional about self care and making healthy eating choices. Pt continuing on the NOOM program and feels that it is teaching her good eating habits. Pt reports that she has lost 8 lbs, which makes her feel good about herself and more confident.   Pt reports that she got fence built in [employer] are not not cluttering her home, which fill her with a sense of relief.    Discussed physical activity and pt feels that she could be doing better and is going to try harder to work more physical activity into daily schedule.  Reviewed stress management, anxiety management, and self  care techniques. Encouraged pt to continue focusing on overall well being.   Suicidal/Homicidal: No  SI, HI, or AVH reported at time of session.  Therapist Response: Daneisha continues to make good progress with self insight, life management, stress management, and mood regulation. Pt feels she is managing eating behaviors in a healthier way.   Plan: Return again in 8 weeks. Ongoing treatment plan to include mood management, anxiety management, and eating behavior modification.  Diagnosis: Axis I: Major depressive disorder, recurrent, mild; Generalized Anxiety Disorder, eating disorder    Axis II: No diagnosis    Rachel Bo Sabella Traore, LCSW 12/13/2019

## 2019-12-25 DIAGNOSIS — I10 Essential (primary) hypertension: Secondary | ICD-10-CM | POA: Diagnosis not present

## 2019-12-25 DIAGNOSIS — E669 Obesity, unspecified: Secondary | ICD-10-CM | POA: Diagnosis not present

## 2020-01-08 ENCOUNTER — Other Ambulatory Visit: Payer: Self-pay | Admitting: Obstetrics and Gynecology

## 2020-01-08 DIAGNOSIS — N6489 Other specified disorders of breast: Secondary | ICD-10-CM

## 2020-01-10 DIAGNOSIS — Z6838 Body mass index (BMI) 38.0-38.9, adult: Secondary | ICD-10-CM | POA: Diagnosis not present

## 2020-01-10 DIAGNOSIS — M8588 Other specified disorders of bone density and structure, other site: Secondary | ICD-10-CM | POA: Diagnosis not present

## 2020-01-10 DIAGNOSIS — Z124 Encounter for screening for malignant neoplasm of cervix: Secondary | ICD-10-CM | POA: Diagnosis not present

## 2020-01-10 DIAGNOSIS — N958 Other specified menopausal and perimenopausal disorders: Secondary | ICD-10-CM | POA: Diagnosis not present

## 2020-02-11 ENCOUNTER — Ambulatory Visit: Payer: Medicare PPO | Admitting: Licensed Clinical Social Worker

## 2020-02-13 ENCOUNTER — Ambulatory Visit
Admission: RE | Admit: 2020-02-13 | Discharge: 2020-02-13 | Disposition: A | Discharge status: Home / Self Care | Payer: Medicare PPO | Source: Ambulatory Visit | Attending: Obstetrics and Gynecology | Admitting: Obstetrics and Gynecology

## 2020-02-13 ENCOUNTER — Other Ambulatory Visit: Payer: Self-pay

## 2020-02-13 DIAGNOSIS — N6489 Other specified disorders of breast: Secondary | ICD-10-CM

## 2020-02-13 DIAGNOSIS — R928 Other abnormal and inconclusive findings on diagnostic imaging of breast: Secondary | ICD-10-CM | POA: Diagnosis not present

## 2020-04-08 DIAGNOSIS — J301 Allergic rhinitis due to pollen: Secondary | ICD-10-CM | POA: Diagnosis not present

## 2020-04-08 DIAGNOSIS — H903 Sensorineural hearing loss, bilateral: Secondary | ICD-10-CM | POA: Diagnosis not present

## 2020-04-09 DIAGNOSIS — H353131 Nonexudative age-related macular degeneration, bilateral, early dry stage: Secondary | ICD-10-CM | POA: Diagnosis not present

## 2020-04-16 DIAGNOSIS — F330 Major depressive disorder, recurrent, mild: Secondary | ICD-10-CM | POA: Diagnosis not present

## 2020-04-16 DIAGNOSIS — J069 Acute upper respiratory infection, unspecified: Secondary | ICD-10-CM | POA: Diagnosis not present

## 2020-04-16 DIAGNOSIS — E669 Obesity, unspecified: Secondary | ICD-10-CM | POA: Diagnosis not present

## 2020-04-16 DIAGNOSIS — I10 Essential (primary) hypertension: Secondary | ICD-10-CM | POA: Diagnosis not present

## 2020-04-16 DIAGNOSIS — E039 Hypothyroidism, unspecified: Secondary | ICD-10-CM | POA: Diagnosis not present

## 2020-04-16 DIAGNOSIS — M1990 Unspecified osteoarthritis, unspecified site: Secondary | ICD-10-CM | POA: Diagnosis not present

## 2020-04-16 DIAGNOSIS — D369 Benign neoplasm, unspecified site: Secondary | ICD-10-CM | POA: Diagnosis not present

## 2020-04-16 DIAGNOSIS — Z6838 Body mass index (BMI) 38.0-38.9, adult: Secondary | ICD-10-CM | POA: Diagnosis not present

## 2020-04-16 DIAGNOSIS — R7303 Prediabetes: Secondary | ICD-10-CM | POA: Diagnosis not present

## 2020-05-06 DIAGNOSIS — L821 Other seborrheic keratosis: Secondary | ICD-10-CM | POA: Diagnosis not present

## 2020-05-06 DIAGNOSIS — L570 Actinic keratosis: Secondary | ICD-10-CM | POA: Diagnosis not present

## 2020-05-06 DIAGNOSIS — D225 Melanocytic nevi of trunk: Secondary | ICD-10-CM | POA: Diagnosis not present

## 2020-05-06 DIAGNOSIS — L814 Other melanin hyperpigmentation: Secondary | ICD-10-CM | POA: Diagnosis not present

## 2020-05-06 DIAGNOSIS — X32XXXD Exposure to sunlight, subsequent encounter: Secondary | ICD-10-CM | POA: Diagnosis not present

## 2020-06-25 DIAGNOSIS — M1711 Unilateral primary osteoarthritis, right knee: Secondary | ICD-10-CM | POA: Diagnosis not present

## 2020-07-15 DIAGNOSIS — R300 Dysuria: Secondary | ICD-10-CM | POA: Diagnosis not present

## 2020-07-22 DIAGNOSIS — N390 Urinary tract infection, site not specified: Secondary | ICD-10-CM | POA: Diagnosis not present

## 2020-07-22 DIAGNOSIS — R350 Frequency of micturition: Secondary | ICD-10-CM | POA: Diagnosis not present

## 2020-07-22 DIAGNOSIS — R300 Dysuria: Secondary | ICD-10-CM | POA: Diagnosis not present

## 2020-10-07 DIAGNOSIS — H353131 Nonexudative age-related macular degeneration, bilateral, early dry stage: Secondary | ICD-10-CM | POA: Diagnosis not present

## 2020-10-07 DIAGNOSIS — H35033 Hypertensive retinopathy, bilateral: Secondary | ICD-10-CM | POA: Diagnosis not present

## 2020-10-07 DIAGNOSIS — H52223 Regular astigmatism, bilateral: Secondary | ICD-10-CM | POA: Diagnosis not present

## 2020-10-07 DIAGNOSIS — H5203 Hypermetropia, bilateral: Secondary | ICD-10-CM | POA: Diagnosis not present

## 2020-10-07 DIAGNOSIS — H524 Presbyopia: Secondary | ICD-10-CM | POA: Diagnosis not present

## 2020-10-07 DIAGNOSIS — H3530 Unspecified macular degeneration: Secondary | ICD-10-CM | POA: Diagnosis not present

## 2020-10-07 DIAGNOSIS — I10 Essential (primary) hypertension: Secondary | ICD-10-CM | POA: Diagnosis not present

## 2020-10-16 DIAGNOSIS — L02419 Cutaneous abscess of limb, unspecified: Secondary | ICD-10-CM | POA: Diagnosis not present

## 2020-10-16 DIAGNOSIS — L089 Local infection of the skin and subcutaneous tissue, unspecified: Secondary | ICD-10-CM | POA: Diagnosis not present

## 2020-10-23 DIAGNOSIS — R739 Hyperglycemia, unspecified: Secondary | ICD-10-CM | POA: Diagnosis not present

## 2020-10-23 DIAGNOSIS — E559 Vitamin D deficiency, unspecified: Secondary | ICD-10-CM | POA: Diagnosis not present

## 2020-10-23 DIAGNOSIS — E039 Hypothyroidism, unspecified: Secondary | ICD-10-CM | POA: Diagnosis not present

## 2020-10-23 DIAGNOSIS — I10 Essential (primary) hypertension: Secondary | ICD-10-CM | POA: Diagnosis not present

## 2020-10-30 ENCOUNTER — Other Ambulatory Visit: Payer: Self-pay | Admitting: Internal Medicine

## 2020-10-30 DIAGNOSIS — E039 Hypothyroidism, unspecified: Secondary | ICD-10-CM | POA: Diagnosis not present

## 2020-10-30 DIAGNOSIS — R7303 Prediabetes: Secondary | ICD-10-CM | POA: Diagnosis not present

## 2020-10-30 DIAGNOSIS — I700 Atherosclerosis of aorta: Secondary | ICD-10-CM | POA: Diagnosis not present

## 2020-10-30 DIAGNOSIS — Z1339 Encounter for screening examination for other mental health and behavioral disorders: Secondary | ICD-10-CM | POA: Diagnosis not present

## 2020-10-30 DIAGNOSIS — Z1331 Encounter for screening for depression: Secondary | ICD-10-CM | POA: Diagnosis not present

## 2020-10-30 DIAGNOSIS — I10 Essential (primary) hypertension: Secondary | ICD-10-CM | POA: Diagnosis not present

## 2020-10-30 DIAGNOSIS — R0600 Dyspnea, unspecified: Secondary | ICD-10-CM | POA: Diagnosis not present

## 2020-10-30 DIAGNOSIS — E559 Vitamin D deficiency, unspecified: Secondary | ICD-10-CM | POA: Diagnosis not present

## 2020-10-30 DIAGNOSIS — Z0000 Encounter for general adult medical examination without abnormal findings: Secondary | ICD-10-CM

## 2020-10-30 DIAGNOSIS — M542 Cervicalgia: Secondary | ICD-10-CM | POA: Diagnosis not present

## 2020-10-30 DIAGNOSIS — Z87891 Personal history of nicotine dependence: Secondary | ICD-10-CM

## 2020-11-14 DIAGNOSIS — Z1283 Encounter for screening for malignant neoplasm of skin: Secondary | ICD-10-CM | POA: Diagnosis not present

## 2020-11-14 DIAGNOSIS — L570 Actinic keratosis: Secondary | ICD-10-CM | POA: Diagnosis not present

## 2020-11-14 DIAGNOSIS — X32XXXD Exposure to sunlight, subsequent encounter: Secondary | ICD-10-CM | POA: Diagnosis not present

## 2020-11-14 DIAGNOSIS — D225 Melanocytic nevi of trunk: Secondary | ICD-10-CM | POA: Diagnosis not present

## 2020-11-14 DIAGNOSIS — B078 Other viral warts: Secondary | ICD-10-CM | POA: Diagnosis not present

## 2020-11-21 ENCOUNTER — Other Ambulatory Visit: Payer: Self-pay | Admitting: Obstetrics and Gynecology

## 2020-11-21 DIAGNOSIS — N6320 Unspecified lump in the left breast, unspecified quadrant: Secondary | ICD-10-CM | POA: Diagnosis not present

## 2020-11-24 ENCOUNTER — Ambulatory Visit
Admission: RE | Admit: 2020-11-24 | Discharge: 2020-11-24 | Disposition: A | Discharge status: Home / Self Care | Payer: Medicare PPO | Source: Ambulatory Visit | Attending: Internal Medicine | Admitting: Internal Medicine

## 2020-11-24 DIAGNOSIS — Z0000 Encounter for general adult medical examination without abnormal findings: Secondary | ICD-10-CM

## 2020-11-24 DIAGNOSIS — Z87891 Personal history of nicotine dependence: Secondary | ICD-10-CM

## 2020-11-26 DIAGNOSIS — M1711 Unilateral primary osteoarthritis, right knee: Secondary | ICD-10-CM | POA: Diagnosis not present

## 2020-12-01 ENCOUNTER — Ambulatory Visit
Admission: RE | Admit: 2020-12-01 | Discharge: 2020-12-01 | Disposition: A | Discharge status: Home / Self Care | Payer: Medicare PPO | Source: Ambulatory Visit | Attending: Obstetrics and Gynecology | Admitting: Obstetrics and Gynecology

## 2020-12-01 ENCOUNTER — Other Ambulatory Visit: Payer: Self-pay

## 2020-12-01 ENCOUNTER — Other Ambulatory Visit: Payer: Self-pay | Admitting: Obstetrics and Gynecology

## 2020-12-01 DIAGNOSIS — N6320 Unspecified lump in the left breast, unspecified quadrant: Secondary | ICD-10-CM

## 2020-12-01 DIAGNOSIS — R922 Inconclusive mammogram: Secondary | ICD-10-CM | POA: Diagnosis not present

## 2020-12-01 DIAGNOSIS — R928 Other abnormal and inconclusive findings on diagnostic imaging of breast: Secondary | ICD-10-CM | POA: Diagnosis not present

## 2020-12-01 DIAGNOSIS — N6311 Unspecified lump in the right breast, upper outer quadrant: Secondary | ICD-10-CM | POA: Diagnosis not present

## 2020-12-18 ENCOUNTER — Ambulatory Visit: Payer: Medicare PPO | Admitting: Internal Medicine

## 2020-12-18 ENCOUNTER — Encounter: Payer: Self-pay | Admitting: Internal Medicine

## 2020-12-18 ENCOUNTER — Other Ambulatory Visit: Payer: Self-pay

## 2020-12-18 VITALS — BP 138/64 | HR 91

## 2020-12-18 DIAGNOSIS — R0609 Other forms of dyspnea: Secondary | ICD-10-CM | POA: Diagnosis not present

## 2020-12-18 DIAGNOSIS — Z87891 Personal history of nicotine dependence: Secondary | ICD-10-CM | POA: Diagnosis not present

## 2020-12-18 DIAGNOSIS — J849 Interstitial pulmonary disease, unspecified: Secondary | ICD-10-CM

## 2020-12-18 DIAGNOSIS — Z8616 Personal history of COVID-19: Secondary | ICD-10-CM

## 2020-12-18 NOTE — Patient Instructions (Addendum)
ICD-10-CM   1. ILD (interstitial lung disease) ([place])  J84.9     2. DOE (dyspnea on exertion)  R06.09     3. History of 2019 novel coronavirus disease (COVID-19)  Z86.16     4. History of smoking  Z87.891         - I am concerned you  have Interstitial Lung Disease (ILD); currently mild  - possibly and likely present 2 years ago   -  There are MANY varieties of this  - To narrow down possibilities and assess severity please do the following tests  - do full PFT  - - do overnight oxygen test on room air  - do High Resolution CT chest wo contrast - supine and prone, inspiratory and expiratory images (only Dr Rosario Jacks or Dr Weber Cooks or Dr Polly Cobia or Dr Laqueta Carina to read)  - do autoimmune panel: Serum: ESR, ANA, DS-DNA, RF, anti-CCP, ssA, ssB, scl-70, Total CK,  Aldolase,  Hypersensitivity Pneumonitis Panel and Quantiferon Gold  Followup  - [place] in 4-8 weeks for 30 min visit to discuss results and next steps

## 2020-12-18 NOTE — Progress Notes (Signed)
.      OV 12/18/2020 - new consult ILD  Subjective:  Patient ID: Stacy Tucker, female , DOB: 1950-03-17 , age 70 y.o. , MRN: [id number] , ADDRESS: [place] [address] PCP Velna Hatchet, MD Patient Care Team: Velna Hatchet, MD as PCP - General (Internal Medicine) Sanda Klein, MD as Consulting Physician (Cardiology)  This Provider for this visit: Treatment Team:  Attending Provider: Brand Males, MD    12/18/2020 -   Chief Complaint  Patient presents with   Consult    Pt states she is a former smoker and imaging showed ILD. Pt states that she does have complaints of SOB with exertion that is also happening when sleeping. Denies any complaints of cough, wheezing, or chest discomfort.     HPI Stacy Tucker 71 y.o. -presents with her only daughter [name].  She is a former Education officer, museum.  This concern for interstitial lung disease.  She is naturally nervous about this.  She tells me that she used to smoke and she quit.  She had a low-dose CT scan of the chest 2 years ago and she was reassured [in my personal visualization she has early interstitial lung abnormalities even 2 years ago and I showed this to her].  That she had a repeat low-dose CT scan and this is now more pronounced [I personally visualized this and I do agree with that] and therefore she is being referred here.  She says that she did had COVID in the interim particularly around June 2022 with high fever.  This is an outpatient treatment.  Nevertheless since 2016 she feels she said insidious worsening of dyspnea in onset.  She is overweight.  She is attributed some of the dyspnea due to that.  Details of her interstitial lung disease questionnaire are listed below    [facility] Comprehensive ILD Questionnaire  Symptoms:  SYMPTOM SCALE - ILD 12/18/2020  Current weight   O2 use ra  Shortness of Breath 0 -> 5 scale with 5 being worst (score 6 If unable to do)  At rest  0.25  Simple tasks - showers, clothes change, eating, shaving 0.25  Household (dishes, doing bed, laundry) 0.25  Shopping 0.25  Walking level at own pace [place] for a while - 2 blocks  Walking up Stairs sometimes  Total (30-36) Dyspnea Score x  How bad is your cough? 0  How bad is your fatigue 2  How bad is nausea 0  How bad is vomiting?  0  How bad is diarrhea? 2  How bad is anxiety? ok  How bad is depression Takes lexapro and fine  Any chronic pain - if so where and how bad Chronic arthritis pain       Past Medical History :  -She had allergy skin test around 2016. She says she is allergic to most plants and grasses this has been going on for several decades - She has chronic arthritis.  Early morning she is actually better it gets worse through the course of the day.  Is been going on for 20 years.  No formal diagnosis of rheumatoid arthritis - She has had a left knee replacement.  She says she needs a right knee replacement - She does have hypertension - She is not diabetic but her hemoglobin A1c is slightly high according to history - She does take Synthroid - She will issue might of had pneumonia in the distant past - Did have COVID in June  2022 with 2 days of high fever no respiratory problems - she is history of tick bite in summer 2021 - - summer 2022 - spider bite in the last 1 year or so. - Measles , chicken pox, strep throat - 1997 asthma attack - wlkig pneumonia several times in pst years - 2016: had it for 3 months   ROS:  -Has arthralgia for 20 years.  Also has some fatigue. - Has some acid reflux symptoms on and off - Does do some snoring  FAMILY HISTORY of LUNG DISEASE:   -Her grandmother had deformed fingers as she aged.?  Rheumatoid arthritis but she is not sure - Her brother had childhood asthma - Otherwise negative  PERSONAL EXPOSURE HISTORY:   -He smokes cigarettes 20 1965 in 2017.  Smoked 10 to 20 cigarettes/day and then quit.  No cigar use.  No  passive smoking.  Some over 20 years ago she took 1 or 2 puffs of marijuana year very rarely but then stopped.  No cocaine use.  No intravenous drug use  HOME  EXPOSURE and HOBBY DETAILS :  [place] the last 3 months.  Age of the current home is built in 43.  She used to live with her ex-husband.  Then apartment and now townhouse since July 2022.  In one of her old house that she had as most problem but then she moved out.  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  She likes to pot plants.  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  She worked a bit in 2003 in 2016.  In her school office room there was no mold but in other rooms there was mold.  In 2002 she worked in [employer] in [place] and that had mold.  Her shortness of breath started after she stopped working in 2016.   - She does gardening for a hobby - Otherwise detail organic and inorganic antigen questions are negative  PULMONARY TOXICITY HISTORY (27 items):  - macrodantin - 1980s  INVESTIGATIONS: below      Simple office walk 185 feet x  3 laps goal with forehead probe 12/18/2020    O2 used ra   Number laps completed 3   Comments about pace mod   Resting Pulse Ox/HR 99% and 91/min   Final Pulse Ox/HR 94% and 131/min   Desaturated </= 88% no   Desaturated <= 3% points yes   Got Tachycardic >/= 90/min yes   Symptoms at end of test Moderte dyspnea   Miscellaneous comments x        Results for DARCHELLE, NUNES (MRN [id number]) as of 12/18/2020 14:57  Ref. Range 07/26/2019 10:54  Creatinine Latest Ref Range: 0.44 - 1.00 mg/dL 0.75   Results for VIOLET, SEABURY (MRN [id number]) as of 12/18/2020 14:57  Ref. Range 07/26/2019 10:54  Hemoglobin Latest Ref Range: 12.0 - 15.0 g/dL 13.5    CT Chest data - Low Dose CT 11/24/20   CLINICAL DATA:  70 year old female former  smoker (quit 5 years ago) with 38 pack-year history of smoking. Lung cancer screening examination.   EXAM: CT CHEST WITHOUT CONTRAST LOW-DOSE FOR LUNG CANCER SCREENING   TECHNIQUE: Multidetector CT imaging of the chest was performed following the standard protocol without IV contrast.   COMPARISON:  Low-dose lung cancer screening chest CT 11/06/2018.   FINDINGS: Cardiovascular: Heart size  is normal. There is no significant pericardial fluid, thickening or pericardial calcification. Aortic atherosclerosis. No definite coronary artery calcifications.   Mediastinum/Nodes: No pathologically enlarged mediastinal or hilar lymph nodes. Please note that accurate exclusion of hilar adenopathy is limited on noncontrast CT scans. Esophagus is unremarkable in appearance. No axillary lymphadenopathy.   Lungs/Pleura: No definite suspicious appearing pulmonary nodules or masses are noted. There is increasing patchy peripheral predominant ground-glass attenuation, septal thickening and some subpleural reticulation most evident in the mid to lower lungs bilaterally, concerning for progressive interstitial lung disease. No traction bronchiectasis or frank honeycombing. There is also mild diffuse bronchial wall thickening with very mild centrilobular and paraseptal emphysema. No acute consolidative airspace disease. No pleural effusions.   Upper Abdomen: Severe diffuse low attenuation throughout the visualized hepatic parenchyma, indicative of severe hepatic steatosis. Aortic atherosclerosis.   Musculoskeletal: Lucent lesion with vertical trabeculations in the left side of the L2 vertebral body, most compatible with a cavernous hemangioma. There are no aggressive appearing lytic or blastic lesions noted in the visualized portions of the skeleton.   IMPRESSION: 1. Lung-RADS 1S, negative. Continue annual screening with low-dose chest CT without contrast in 12 months. 2. The "S" modifier above  refers to potentially clinically significant non lung cancer related findings. Specifically, there are findings suggestive of progressive interstitial lung disease, as above, categorized as indeterminate for usual interstitial pneumonia (UIP) per current ATS guidelines. Outpatient referral to Pulmonology for further clinical evaluation is recommended. 3. Aortic atherosclerosis. 4. Mild diffuse bronchial wall thickening with very mild centrilobular and paraseptal emphysema. 5. Severe hepatic steatosis.   Aortic Atherosclerosis (ICD10-I70.0) and Emphysema (ICD10-J43.9).     Electronically Signed   By: Vinnie Langton M.D.   On: 11/25/2020 09:58      has a past medical history of Allergy, Anal skin tags (10/18/2016), Arthritis, Cancer ([place]), Chicken pox, Depression, Endometriosis, External hemorrhoid, H/O bladder infections, adenomatous polyp of colon (04/24/2014), Hypertension, Internal hemorrhoid, Pneumonia, Prolapsed internal hemorrhoids, grade 2 (10/18/2016), Thyroid disease, and Vaginal delivery.   reports that she quit smoking about 6 years ago. Her smoking use included cigarettes. She has never used smokeless tobacco.  Past Surgical History:  Procedure Laterality Date   CARDIAC CATHETERIZATION     all normal    COLONOSCOPY W/ POLYPECTOMY     HEMORRHOID BANDING     TOTAL KNEE ARTHROPLASTY Left 2012   wisdom  teeth     no sedation with this, just novacaine    Allergies  Allergen Reactions   Doxycycline Swelling   Augmentin [Amoxicillin-Pot Clavulanate] Nausea And Vomiting   Hydrocodone Other (See Comments)    Eyes would cross with this    Influenza Vaccines Other (See Comments)    Fever, severe flu symptoms   Lorazepam Hives   Macrodantin [Nitrofurantoin Macrocrystal] Hives   Penicillins Other (See Comments)    As child    Pristiq [Desvenlafaxine] Other (See Comments)    Fever and legs stiff, couldn't ambulate    Sulfa Antibiotics Rash    Immunization History   Administered Date(s) Administered   Tdap 04/22/2013   Unspecified SARS-COV-2 Vaccination 03/27/2020   Zoster, Live 04/04/2015, 08/04/2015    Family History  Problem Relation Age of Onset   Colon cancer Paternal Grandmother 98       no intervention   Arthritis Paternal Grandmother    Dementia Paternal Grandmother    Arthritis Mother    Heart disease Mother    Hypertension Mother    Depression Mother  Dementia Mother    Heart attack Mother    Other Mother        C-Diff, and also had flesh eating bateria (strep type) on her right calf   Arthritis Father    Alcohol abuse Paternal Grandfather    Hypertension Brother    Depression Brother    Arthritis Daughter    Breast cancer Maternal Aunt    Hypertension Brother    Depression Brother    Breast cancer Cousin    Esophageal cancer Neg Hx    Rectal cancer Neg Hx    Stomach cancer Neg Hx      Current Outpatient Medications:    betamethasone valerate (VALISONE) 0.1 % cream, Apply 1 application topically daily as needed (for skin irritation). , Disp: , Rfl:    Chlorphen-PE-Acetaminophen (CORICIDIN D COLD/FLU/SINUS PO), Take 1 tablet by mouth daily as needed (for allergy)., Disp: , Rfl:    cholecalciferol (VITAMIN D) 1000 units tablet, Take 2,000 Units by mouth daily., Disp: , Rfl:    escitalopram (LEXAPRO) 10 MG tablet, Take 5 mg by mouth daily., Disp: , Rfl:    estradiol (ESTRACE) 0.1 MG/GM vaginal cream, Place 1 Applicatorful vaginally once a week., Disp: , Rfl:    levothyroxine (SYNTHROID, LEVOTHROID) 75 MCG tablet, Take 75 mcg by mouth daily before breakfast., Disp: , Rfl:    olmesartan (BENICAR) 40 MG tablet, olmesartan 40 mg tablet  TAKE 1 TABLET BY MOUTH EVERY DAY FOR 90 DAYS, Disp: , Rfl:    Omega-3 Fatty Acids (FISH OIL) 1000 MG CAPS, Take 2,000 mg by mouth daily., Disp: , Rfl:    hydrocortisone (ANUSOL-HC) 2.5 % rectal cream, APPLY RECTALLY TWICE A DAY (Patient not taking: No sig reported), Disp: 30 g, Rfl: 0    ibuprofen (ADVIL) 100 MG tablet, Take 200 mg by mouth every 6 (six) hours as needed for pain or fever. (Patient not taking: Reported on 12/18/2020), Disp: , Rfl:       Objective:   Vitals:   12/18/20 1435  BP: 138/64  Pulse: 91  SpO2: 99%    Estimated body mass index is 40.74 kg/m as calculated from the following:   Height as of 07/26/19: 5' 3"  (1.6 m).   Weight as of 07/26/19: 230 lb (104.3 kg).  @WEIGHTCHANGE @  There were no vitals filed for this visit.   Physical Exam  General Appearance:    Alert, cooperative, no distress, appears stated age - yes , Deconditioned looking - no , OBESE  - no, Sitting on Wheelchair -  no  Head:    Normocephalic, without obvious abnormality, atraumatic  Eyes:    PERRL, conjunctiva/corneas clear,  Ears:    Normal TM's and external ear canals, both ears  Nose:   Nares normal, septum midline, mucosa normal, no drainage    or sinus tenderness. OXYGEN ON  - no . Patient is @ ra   Throat:   Lips, mucosa, and tongue normal; teeth and gums normal. Cyanosis on lips - no  Neck:   Supple, symmetrical, trachea midline, no adenopathy;    thyroid:  no enlargement/tenderness/nodules; no carotid   bruit or JVD  Back:     Symmetric, no curvature, ROM normal, no CVA tenderness  Lungs:     Distress - no , Wheeze no, Barrell Chest - no, Purse lip breathing - no, Crackles - no   Chest Wall:    No tenderness or deformity.    Heart:    Regular rate and rhythm, S1 and  S2 normal, no rub   or gallop, Murmur - no  Breast Exam:    NOT DONE  Abdomen:     Soft, non-tender, bowel sounds active all four quadrants,    no masses, no organomegaly. Visceral obesity - yes  Genitalia:   NOT DONE  Rectal:   NOT DONE  Extremities:   Extremities - normal, Has Cane - no, Clubbing - no, Edema - no  Pulses:   2+ and symmetric all extremities  Skin:   Stigmata of Connective Tissue Disease - no  Lymph nodes:   Cervical, supraclavicular, and axillary nodes normal  Psychiatric:   Neurologic:   Pleasant - yes, Anxious - no, Flat affect - no  CAm-ICU - neg, Alert and Oriented x 3 - yes, Moves all 4s - yes, Speech - normal, Cognition - intact         Assessment:       ICD-10-CM   1. ILD (interstitial lung disease) ([facility])  J84.9     2. DOE (dyspnea on exertion)  R06.09     3. History of 2019 novel coronavirus disease (COVID-19)  Z86.16     4. History of smoking  Z87.891          Plan:     Patient Instructions     ICD-10-CM   1. ILD (interstitial lung disease) ([facility])  J84.9     2. DOE (dyspnea on exertion)  R06.09     3. History of 2019 novel coronavirus disease (COVID-19)  Z86.16     4. History of smoking  Z87.891         - I am concerned you  have Interstitial Lung Disease (ILD); currently mild  - possibly and likely present 2 years ago   -  There are MANY varieties of this  - To narrow down possibilities and assess severity please do the following tests  - do full PFT  - - do overnight oxygen test on room air  - do High Resolution CT chest wo contrast - supine and prone, inspiratory and expiratory images (only Dr Rosario Jacks or Dr Weber Cooks or Dr Polly Cobia or Dr Laqueta Carina to read)  - do autoimmune panel: Serum: ESR, ANA, DS-DNA, RF, anti-CCP, ssA, ssB, scl-70, Total CK,  Aldolase,  Hypersensitivity Pneumonitis Panel and Quantiferon Gold  Followup  - [place] in 4-8 weeks for 30 min visit to discuss results and next steps    SIGNATURE    Dr. Brand Males, M.D., F.C.C.P,  Pulmonary and Critical Care Medicine Staff Physician, [place] Director - Interstitial Lung Disease  Program  Pulmonary [facility] at [place], [place], [address]  Pager: [phone], If no answer or between  15:00h - 7:00h: call [phone] Telephone: [phone]  3:25 PM 12/18/2020

## 2020-12-19 LAB — SEDIMENTATION RATE: Sed Rate: 14 mm/hr (ref 0–30)

## 2020-12-20 DIAGNOSIS — R0683 Snoring: Secondary | ICD-10-CM | POA: Diagnosis not present

## 2020-12-20 DIAGNOSIS — G4730 Sleep apnea, unspecified: Secondary | ICD-10-CM | POA: Diagnosis not present

## 2020-12-20 LAB — QUANTIFERON-TB GOLD PLUS
Mitogen-NIL: >10 IU/mL
NIL: 0.03 IU/mL
QuantiFERON-TB Gold Plus: NEGATIVE
TB1-NIL: 0 IU/mL
TB2-NIL: 0 IU/mL

## 2020-12-23 LAB — CYCLIC CITRUL PEPTIDE ANTIBODY, IGG: Cyclic Citrullin Peptide Ab: <16 UNITS

## 2020-12-23 LAB — ANTI-SCLERODERMA ANTIBODY: Scleroderma (Scl-70) (ENA) Antibody, IgG: <1 AI

## 2020-12-23 LAB — SJOGREN'S SYNDROME ANTIBODS(SSA + SSB)
SSA (Ro) (ENA) Antibody, IgG: <1 AI
SSB (La) (ENA) Antibody, IgG: <1 AI

## 2020-12-23 LAB — HYPERSENSITIVITY PNEUMONITIS
A. Pullulans Abs: NEGATIVE
A.Fumigatus #1 Abs: NEGATIVE
Micropolyspora faeni, IgG: NEGATIVE
Pigeon Serum Abs: NEGATIVE
Thermoact. Saccharii: NEGATIVE
Thermoactinomyces vulgaris, IgG: NEGATIVE

## 2020-12-23 LAB — CK TOTAL AND CKMB (NOT AT ARMC): Total CK: 69 U/L (ref 29–143)

## 2020-12-23 LAB — ANTI-DNA ANTIBODY, DOUBLE-STRANDED: ds DNA Ab: 1 IU/mL

## 2020-12-23 LAB — ALDOLASE: Aldolase: 5.7 U/L (ref <=8.1)

## 2020-12-23 LAB — RHEUMATOID FACTOR: Rheumatoid fact SerPl-aCnc: <14 IU/mL (ref <14)

## 2020-12-23 LAB — ANA: Anti Nuclear Antibody (ANA): NEGATIVE

## 2020-12-31 ENCOUNTER — Telehealth: Payer: Self-pay | Admitting: Internal Medicine

## 2020-12-31 NOTE — Telephone Encounter (Signed)
   ONO - 11/2920  - RA - pusle ox <= 88% is 14min 44sec  Plan  - no need for night o2  THanks    SIGNATURE    Dr. Brand Males, M.D., F.C.C.P,  Pulmonary and Critical Care Medicine Staff Physician, [place] Director - Interstitial Lung Disease  Program  Pulmonary [facility] at [place], [place], [address]  NPI Number:  NPI #[id number]  Pager: [phone], If no answer  -> Check AMION or Try [phone] Telephone (clinical office): [phone] Telephone (research): [phone]  12:51 PM 12/31/2020

## 2020-12-31 NOTE — Telephone Encounter (Signed)
Called and spoke with pt letting her know the results of recent ONO and she verbalized understanding. Nothing further needed.

## 2021-01-07 DIAGNOSIS — M1711 Unilateral primary osteoarthritis, right knee: Secondary | ICD-10-CM | POA: Diagnosis not present

## 2021-01-14 DIAGNOSIS — M1711 Unilateral primary osteoarthritis, right knee: Secondary | ICD-10-CM | POA: Diagnosis not present

## 2021-01-19 ENCOUNTER — Ambulatory Visit
Admission: RE | Admit: 2021-01-19 | Discharge: 2021-01-19 | Disposition: A | Discharge status: Home / Self Care | Payer: Medicare PPO | Source: Ambulatory Visit | Attending: Internal Medicine | Admitting: Internal Medicine

## 2021-01-19 DIAGNOSIS — K449 Diaphragmatic hernia without obstruction or gangrene: Secondary | ICD-10-CM | POA: Diagnosis not present

## 2021-01-19 DIAGNOSIS — J849 Interstitial pulmonary disease, unspecified: Secondary | ICD-10-CM

## 2021-01-19 DIAGNOSIS — I700 Atherosclerosis of aorta: Secondary | ICD-10-CM | POA: Diagnosis not present

## 2021-01-19 DIAGNOSIS — I2510 Atherosclerotic heart disease of native coronary artery without angina pectoris: Secondary | ICD-10-CM | POA: Diagnosis not present

## 2021-01-21 DIAGNOSIS — M1711 Unilateral primary osteoarthritis, right knee: Secondary | ICD-10-CM | POA: Diagnosis not present

## 2021-01-27 ENCOUNTER — Ambulatory Visit: Payer: Medicare PPO | Admitting: Internal Medicine

## 2021-01-28 ENCOUNTER — Ambulatory Visit: Payer: Medicare PPO | Admitting: Internal Medicine

## 2021-02-13 ENCOUNTER — Other Ambulatory Visit: Payer: Self-pay

## 2021-02-13 ENCOUNTER — Ambulatory Visit: Payer: Medicare PPO | Admitting: Internal Medicine

## 2021-02-13 LAB — PULMONARY FUNCTION TEST
DL/VA % pred: 93 %
DL/VA: 3.86 ml/min/mmHg/L
DLCO cor % pred: 85 %
DLCO cor: 16.55 ml/min/mmHg
DLCO unc % pred: 85 %
DLCO unc: 16.55 ml/min/mmHg
FEF 25-75 Post: 2.09 L/sec
FEF 25-75 Pre: 1.62 L/sec
FEF2575-%Change-Post: 28 %
FEF2575-%Pred-Post: 112 %
FEF2575-%Pred-Pre: 87 %
FEV1-%Change-Post: 7 %
FEV1-%Pred-Post: 90 %
FEV1-%Pred-Pre: 84 %
FEV1-Post: 2.02 L
FEV1-Pre: 1.89 L
FEV1FVC-%Change-Post: 2 %
FEV1FVC-%Pred-Pre: 101 %
FEV6-%Change-Post: 7 %
FEV6-%Pred-Post: 90 %
FEV6-%Pred-Pre: 84 %
FEV6-Post: 2.55 L
FEV6-Pre: 2.38 L
FEV6FVC-%Change-Post: 0 %
FEV6FVC-%Pred-Post: 104 %
FEV6FVC-%Pred-Pre: 103 %
FVC-%Change-Post: 4 %
FVC-%Pred-Post: 86 %
FVC-%Pred-Pre: 82 %
FVC-Post: 2.55 L
FVC-Pre: 2.45 L
Post FEV1/FVC ratio: 79 %
Post FEV6/FVC ratio: 100 %
Pre FEV1/FVC ratio: 77 %
Pre FEV6/FVC Ratio: 99 %
RV % pred: 105 %
RV: 2.33 L
TLC % pred: 104 %
TLC: 5.29 L

## 2021-02-26 ENCOUNTER — Other Ambulatory Visit: Payer: Self-pay

## 2021-02-26 ENCOUNTER — Encounter: Payer: Self-pay | Admitting: Internal Medicine

## 2021-02-26 ENCOUNTER — Ambulatory Visit: Payer: Medicare PPO | Admitting: Internal Medicine

## 2021-02-26 VITALS — BP 130/72 | HR 80 | Temp 98.3°F | Ht 63.0 in | Wt 226.4 lb

## 2021-02-26 DIAGNOSIS — R0989 Other specified symptoms and signs involving the circulatory and respiratory systems: Secondary | ICD-10-CM

## 2021-02-26 DIAGNOSIS — R0609 Other forms of dyspnea: Secondary | ICD-10-CM

## 2021-02-26 DIAGNOSIS — Z87891 Personal history of nicotine dependence: Secondary | ICD-10-CM | POA: Diagnosis not present

## 2021-02-26 DIAGNOSIS — Z8616 Personal history of COVID-19: Secondary | ICD-10-CM

## 2021-02-26 MED ORDER — BUDESONIDE-FORMOTEROL FUMARATE 80-4.5 MCG/ACT IN AERO: 2.0000 | INHALATION_SPRAY | Freq: Two times a day (BID) | RESPIRATORY_TRACT | 11 refills | Status: DC

## 2021-02-26 NOTE — Progress Notes (Signed)
OV 12/18/2020 - new consult ILD  Subjective:  Patient ID: Stacy Tucker, female , DOB: May 31, 1950 , age 71 y.o. , MRN: [id number] , ADDRESS: [place] [address] PCP Velna Hatchet, MD Patient Care Team: Velna Hatchet, MD as PCP - General (Internal Medicine) Sanda Klein, MD as Consulting Physician (Cardiology)  This Provider for this visit: Treatment Team:  Attending Provider: Brand Males, MD    12/18/2020 -   Chief Complaint  Patient presents with   Consult    Pt states she is a former smoker and imaging showed ILD. Pt states that she does have complaints of SOB with exertion that is also happening when sleeping. Denies any complaints of cough, wheezing, or chest discomfort.     HPI NEZIAH Tucker 71 y.o. -presents with her only daughter [name].  She is a former Education officer, museum.  This concern for interstitial lung disease.  She is naturally nervous about this.  She tells me that she used to smoke and she quit.  She had a low-dose CT scan of the chest 2 years ago and she was reassured [in my personal visualization she has early interstitial lung abnormalities even 2 years ago and I showed this to her].  That she had a repeat low-dose CT scan and this is now more pronounced [I personally visualized this and I do agree with that] and therefore she is being referred here.  She says that she did had COVID in the interim particularly around June 2022 with high fever.  This is an outpatient treatment.  Nevertheless since 2016 she feels she said insidious worsening of dyspnea in onset.  She is overweight.  She is attributed some of the dyspnea due to that.  Details of her interstitial lung disease questionnaire are listed below    [facility] Comprehensive ILD Questionnaire  Symptoms:  SYMPTOM SCALE - ILD 12/18/2020  Current weight   O2 use ra  Shortness of Breath 0 -> 5 scale with 5 being worst (score 6 If unable to do)  At rest 0.25   Simple tasks - showers, clothes change, eating, shaving 0.25  Household (dishes, doing bed, laundry) 0.25  Shopping 0.25  Walking level at own pace [place] for a while - 2 blocks  Walking up Stairs sometimes  Total (30-36) Dyspnea Score x  How bad is your cough? 0  How bad is your fatigue 2  How bad is nausea 0  How bad is vomiting?  0  How bad is diarrhea? 2  How bad is anxiety? ok  How bad is depression Takes lexapro and fine  Any chronic pain - if so where and how bad Chronic arthritis pain       Past Medical History :  -She had allergy skin test around 2016. She says she is allergic to most plants and grasses this has been going on for several decades - She has chronic arthritis.  Early morning she is actually better it gets worse through the course of the day.  Is been going on for 20 years.  No formal diagnosis of rheumatoid arthritis - She has had a left knee replacement.  She says she needs a right knee replacement - She does have hypertension - She is not diabetic but her hemoglobin A1c is slightly high according to history - She does take Synthroid - She will issue might of had pneumonia in the distant past - Did have COVID in June 2022 with 2  days of high fever no respiratory problems - she is history of tick bite in summer 2021 - - summer 2022 - spider bite in the last 1 year or so. - Measles , chicken pox, strep throat - 1997 asthma attack - wlkig pneumonia several times in pst years - 2016: had it for 3 months   ROS:  -Has arthralgia for 20 years.  Also has some fatigue. - Has some acid reflux symptoms on and off - Does do some snoring  FAMILY HISTORY of LUNG DISEASE:   -Her grandmother had deformed fingers as she aged.?  Rheumatoid arthritis but she is not sure - Her brother had childhood asthma - Otherwise negative  PERSONAL EXPOSURE HISTORY:   -He smokes cigarettes 20 1965 in 2017.  Smoked 10 to 20 cigarettes/day and then quit.  No cigar use.  No passive  smoking.  Some over 20 years ago she took 1 or 2 puffs of marijuana year very rarely but then stopped.  No cocaine use.  No intravenous drug use  HOME  EXPOSURE and HOBBY DETAILS :  [place] the last 3 months.  Age of the current home is built in 5.  She used to live with her ex-husband.  Then apartment and now townhouse since July 2022.  In one of her old house that she had as most problem but then she moved out.  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  She likes to pot plants.  OCCUPATIONAL HISTORY (122 questions) :  -Worked as a Education officer, museum in [place] and retired in 2016.  She worked a bit in 2003 in 2016.  In her school office room there was no mold but in other rooms there was mold.  In 2002 she worked in [employer] in [place] and that had mold.  Her shortness of breath started after she stopped working in 2016.   - She does gardening for a hobby - Otherwise detail organic and inorganic antigen questions are negative  PULMONARY TOXICITY HISTORY (27 items):  - macrodantin - 1980s  INVESTIGATIONS: below      Simple office walk 185 feet x  3 laps goal with forehead probe 12/18/2020    O2 used ra   Number laps completed 3   Comments about pace mod   Resting Pulse Ox/HR 99% and 91/min   Final Pulse Ox/HR 94% and 131/min   Desaturated </= 88% no   Desaturated <= 3% points yes   Got Tachycardic >/= 90/min yes   Symptoms at end of test Moderte dyspnea   Miscellaneous comments x        Results for SHADDAI, SHAPLEY (MRN [id number]) as of 12/18/2020 14:57  Ref. Range 07/26/2019 10:54  Creatinine Latest Ref Range: 0.44 - 1.00 mg/dL 0.75   Results for RUNETTE, SCIFRES (MRN [id number]) as of 12/18/2020 14:57  Ref. Range 07/26/2019 10:54  Hemoglobin Latest Ref Range: 12.0 - 15.0 g/dL 13.5    CT Chest data - Low Dose CT 11/24/20   CLINICAL DATA:  71 year old female former smoker  (quit 5 years ago) with 38 pack-year history of smoking. Lung cancer screening examination.   EXAM: CT CHEST WITHOUT CONTRAST LOW-DOSE FOR LUNG CANCER SCREENING   TECHNIQUE: Multidetector CT imaging of the chest was performed following the standard protocol without IV contrast.   COMPARISON:  Low-dose lung cancer screening chest CT 11/06/2018.   FINDINGS: Cardiovascular: Heart size is normal. There  is no significant pericardial fluid, thickening or pericardial calcification. Aortic atherosclerosis. No definite coronary artery calcifications.   Mediastinum/Nodes: No pathologically enlarged mediastinal or hilar lymph nodes. Please note that accurate exclusion of hilar adenopathy is limited on noncontrast CT scans. Esophagus is unremarkable in appearance. No axillary lymphadenopathy.   Lungs/Pleura: No definite suspicious appearing pulmonary nodules or masses are noted. There is increasing patchy peripheral predominant ground-glass attenuation, septal thickening and some subpleural reticulation most evident in the mid to lower lungs bilaterally, concerning for progressive interstitial lung disease. No traction bronchiectasis or frank honeycombing. There is also mild diffuse bronchial wall thickening with very mild centrilobular and paraseptal emphysema. No acute consolidative airspace disease. No pleural effusions.   Upper Abdomen: Severe diffuse low attenuation throughout the visualized hepatic parenchyma, indicative of severe hepatic steatosis. Aortic atherosclerosis.   Musculoskeletal: Lucent lesion with vertical trabeculations in the left side of the L2 vertebral body, most compatible with a cavernous hemangioma. There are no aggressive appearing lytic or blastic lesions noted in the visualized portions of the skeleton.   IMPRESSION: 1. Lung-RADS 1S, negative. Continue annual screening with low-dose chest CT without contrast in 12 months. 2. The "S" modifier above refers  to potentially clinically significant non lung cancer related findings. Specifically, there are findings suggestive of progressive interstitial lung disease, as above, categorized as indeterminate for usual interstitial pneumonia (UIP) per current ATS guidelines. Outpatient referral to Pulmonology for further clinical evaluation is recommended. 3. Aortic atherosclerosis. 4. Mild diffuse bronchial wall thickening with very mild centrilobular and paraseptal emphysema. 5. Severe hepatic steatosis.   Aortic Atherosclerosis (ICD10-I70.0) and Emphysema (ICD10-J43.9).     Electronically Signed   By: Vinnie Langton M.D.   On: 11/25/2020 09:58      OV 02/26/2021  Subjective:  Patient ID: Stacy Tucker, female , DOB: 1950/11/13 , age 1 y.o. , MRN: [id number] , ADDRESS: [place] [address] PCP Velna Hatchet, MD Patient Care Team: Velna Hatchet, MD as PCP - General (Internal Medicine) Sanda Klein, MD as Consulting Physician (Cardiology)  This Provider for this visit: Treatment Team:  Attending Provider: Brand Males, MD    02/26/2021 -   Chief Complaint  Patient presents with   Follow-up    PFT performed 12/23. Pt states she has been doing okay since last visit and denies any complaints.    ILD work-up in progress HPI SHALISE ROSADO 71 y.o. -presents for follow-up.  She had pulmonary function test as part of evaluation for ILD and this is normal.  She had high-resolution CT chest read by Dr. Lorin Picket.  I personally visualized the film.  Dr. Rosario Jacks does not feel features stating with interstitial lung disease.  I have personally looked at the film and thought there might be some scarring at the lung base which Dr. Rosario Jacks acknowledges.  I believe based on Dr. Stark Klein interpretation there is either early ILA or nonspecific scarring.  Nevertheless normal lung function would be consistent with absence of interstitial lung disease.  I have  communicated this with the patient.  There is air trapping.  She does have dyspnea on exertion.  She understands her dyspnea could be because of obesity and also physical deconditioning.  She is willing to try empiric inhaler for the air trapping.  She was in tears of joy upon hearing that she does not have overt ILD.   Symptoms:  SYMPTOM SCALE - ILD 12/18/2020 02/26/2021   Current weight    O2 use ra  ra  Shortness of Breath 0 -> 5 scale with 5 being worst (score 6 If unable to do)   At rest 0.25 0  Simple tasks - showers, clothes change, eating, shaving 0.25 1  Household (dishes, doing bed, laundry) 0.25 1  Shopping 0.25 1  Walking level at own pace [place] for a while - 2 blocks 2  Walking up Stairs sometimes 2  Total (30-36) Dyspnea Score x 7  How bad is your cough? 0 0  How bad is your fatigue 2 1  How bad is nausea 0 0  How bad is vomiting?  0 0  How bad is diarrhea? 2 0  How bad is anxiety? ok 1  How bad is depression Takes lexapro and fine 1  Any chronic pain - if so where and how bad Chronic arthritis pain    CT Chest data 0 HRCT 01/19/21  Narrative & Impression  CLINICAL DATA:  Interstitial lung disease   EXAM: CT CHEST WITHOUT CONTRAST   TECHNIQUE: Multidetector CT imaging of the chest was performed following the standard protocol without intravenous contrast. High resolution imaging of the lungs, as well as inspiratory and expiratory imaging, was performed.   COMPARISON:  November 24, 2020 chest CT   FINDINGS: Cardiovascular: Normal heart size. No pericardial effusion. No significant coronary artery calcifications. Mild atherosclerotic disease of the thoracic aorta.   Mediastinum/Nodes: Small hiatal hernia. Thyroid is unremarkable. No enlarged lymph nodes seen in the chest.   Lungs/Pleura: Central airways are patent. Moderate bilateral air trapping. No consolidation, pleural effusion pneumothorax. Linear opacities of the lingula and left lower lobe which  persist with prone imaging, likely due to scarring. No evidence of traction bronchiectasis or honeycomb change.   Upper Abdomen: Hepatic steatosis. A stone is seen in the gallbladder neck. No acute abnormality.   Musculoskeletal: Multilevel degenerative disc disease. No aggressive appearing osseous lesions.   IMPRESSION: 1. Moderate bilateral air trapping, findings can be seen in the setting of small airways disease. 2. No evidence of fibrotic interstitial lung disease 3. A stone is seen in the gallbladder neck. 4. Aortic Atherosclerosis (ICD10-I70.0).     Electronically Signed   By: Yetta Glassman M.D.   On: 01/19/2021 09:42      No results found.    Simple office walk 185 feet x  3 laps goal with forehead probe 12/18/2020  02/26/2021 Changed lexapro to wellbutrn  O2 used ra   Number laps completed 3   Comments about pace mod   Resting Pulse Ox/HR 99% and 91/min   Final Pulse Ox/HR 94% and 131/min   Desaturated </= 88% no   Desaturated <= 3% points yes   Got Tachycardic >/= 90/min yes   Symptoms at end of test Moderte dyspnea   Miscellaneous comments x     PFT  PFT Results Latest Ref Rng & Units 02/13/2021  FVC-Pre L 2.45  FVC-Predicted Pre % 82  FVC-Post L 2.55  FVC-Predicted Post % 86  Pre FEV1/FVC % % 77  Post FEV1/FCV % % 79  FEV1-Pre L 1.89  FEV1-Predicted Pre % 84  FEV1-Post L 2.02  DLCO uncorrected ml/min/mmHg 16.55  DLCO UNC% % 85  DLCO corrected ml/min/mmHg 16.55  DLCO COR %Predicted % 85  DLVA Predicted % 93  TLC L 5.29  TLC % Predicted % 104  RV % Predicted % 105     Latest Reference Range & Units 12/18/20 15:33  Micropolyspora faeni, IgG Negative  Negative    Latest  Reference Range & Units 12/18/20 15:33 12/18/20 15:34  Anti Nuclear Antibody (ANA) NEGATIVE  NEGATIVE   Cyclic Citrullin Peptide Ab UNITS <16   ds DNA Ab IU/mL 1   RA Latex Turbid. <14 IU/mL <14   SSA (Ro) (ENA) Antibody, IgG <1.0 NEG AI <1.0 NEG   SSB (La) (ENA)  Antibody, IgG <1.0 NEG AI <1.0 NEG   Scleroderma (Scl-70) (ENA) Antibody, IgG <1.0 NEG AI <1.0 NEG   QUANTIFERON-TB GOLD PLUS   Rpt  A.Fumigatus #1 Abs Negative  Negative   Micropolyspora faeni, IgG Negative  Negative   Thermoactinomyces vulgaris, IgG Negative  Negative   A. Pullulans Abs Negative  Negative   Thermoact. Saccharii Negative  Negative   [place] Serum Abs Negative  Negative   Rpt: View report in Results Review for more information   has a past medical history of Allergy, Anal skin tags (10/18/2016), Arthritis, Cancer ([place]), Chicken pox, Depression, Endometriosis, External hemorrhoid, H/O bladder infections, adenomatous polyp of colon (04/24/2014), Hypertension, Internal hemorrhoid, Pneumonia, Prolapsed internal hemorrhoids, grade 2 (10/18/2016), Thyroid disease, and Vaginal delivery.   reports that she quit smoking about 6 years ago. Her smoking use included cigarettes. She has a 45.00 pack-year smoking history. She has never used smokeless tobacco.  Past Surgical History:  Procedure Laterality Date   CARDIAC CATHETERIZATION     all normal    COLONOSCOPY W/ POLYPECTOMY     HEMORRHOID BANDING     TOTAL KNEE ARTHROPLASTY Left 2012   wisdom  teeth     no sedation with this, just novacaine    Allergies  Allergen Reactions   Doxycycline Swelling   Augmentin [Amoxicillin-Pot Clavulanate] Nausea And Vomiting   Hydrocodone Other (See Comments)    Eyes would cross with this    Influenza Vaccines Other (See Comments)    Fever, severe flu symptoms   Lorazepam Hives   Macrodantin [Nitrofurantoin Macrocrystal] Hives   Penicillins Other (See Comments)    As child    Pristiq [Desvenlafaxine] Other (See Comments)    Fever and legs stiff, couldn't ambulate    Sulfa Antibiotics Rash    Immunization History  Administered Date(s) Administered   Janssen (J&J) SARS-COV-2 Vaccination 03/27/2020   Tdap 04/22/2013   Unspecified SARS-COV-2 Vaccination 03/27/2020   Zoster, Live  04/04/2015, 08/04/2015    Family History  Problem Relation Age of Onset   Colon cancer Paternal Grandmother 98       no intervention   Arthritis Paternal Grandmother    Dementia Paternal Grandmother    Arthritis Mother    Heart disease Mother    Hypertension Mother    Depression Mother    Dementia Mother    Heart attack Mother    Other Mother        C-Diff, and also had flesh eating bateria (strep type) on her right calf   Arthritis Father    Alcohol abuse Paternal Grandfather    Hypertension Brother    Depression Brother    Arthritis Daughter    Breast cancer Maternal Aunt    Hypertension Brother    Depression Brother    Breast cancer Cousin    Esophageal cancer Neg Hx    Rectal cancer Neg Hx    Stomach cancer Neg Hx      Current Outpatient Medications:    betamethasone valerate (VALISONE) 0.1 % cream, Apply 1 application topically daily as needed (for skin irritation). , Disp: , Rfl:    budesonide-formoterol (SYMBICORT) 80-4.5 MCG/ACT inhaler, Inhale 2 puffs  into the lungs in the morning and at bedtime., Disp: 10.2 g, Rfl: 11   buPROPion (WELLBUTRIN XL) 150 MG 24 hr tablet, Take 150 mg by mouth daily., Disp: , Rfl:    Chlorphen-PE-Acetaminophen (CORICIDIN D COLD/FLU/SINUS PO), Take 1 tablet by mouth daily as needed (for allergy)., Disp: , Rfl:    cholecalciferol (VITAMIN D) 1000 units tablet, Take 2,000 Units by mouth daily., Disp: , Rfl:    estradiol (ESTRACE) 0.1 MG/GM vaginal cream, Place 1 Applicatorful vaginally once a week., Disp: , Rfl:    hydrocortisone (ANUSOL-HC) 2.5 % rectal cream, APPLY RECTALLY TWICE A DAY, Disp: 30 g, Rfl: 0   ibuprofen (ADVIL) 100 MG tablet, Take 200 mg by mouth every 6 (six) hours as needed for pain or fever., Disp: , Rfl:    levothyroxine (SYNTHROID, LEVOTHROID) 75 MCG tablet, Take 75 mcg by mouth daily before breakfast., Disp: , Rfl:    olmesartan (BENICAR) 40 MG tablet, olmesartan 40 mg tablet  TAKE 1 TABLET BY MOUTH EVERY DAY FOR 90  DAYS, Disp: , Rfl:    Omega-3 Fatty Acids (FISH OIL) 1000 MG CAPS, Take 2,000 mg by mouth daily., Disp: , Rfl:       Objective:   Vitals:   02/26/21 0929  BP: 130/72  Pulse: 80  Temp: 98.3 F (36.8 C)  TempSrc: Oral  SpO2: 96%  Weight: 226 lb 6.4 oz (102.7 kg)  Height: 5\' 3"  (1.6 m)    Estimated body mass index is 40.1 kg/m as calculated from the following:   Height as of this encounter: 5\' 3"  (1.6 m).   Weight as of this encounter: 226 lb 6.4 oz (102.7 kg).  @WEIGHTCHANGE @  [employer]   02/26/21 0929  Weight: 226 lb 6.4 oz (102.7 kg)     Physical Exam General: No distress. obese Neuro: Alert and Oriented x 3. GCS 15. Speech normal Psych: Pleasant Resp:  Barrel Chest - no.  Wheeze - no, Crackles - no, No overt respiratory distress CVS: Normal heart sounds. Murmurs - no Ext: Stigmata of Connective Tissue Disease - no HEENT: Normal upper airway. PEERL +. No post nasal drip        Assessment:       ICD-10-CM   1. History of smoking  Z87.891     2. History of 2019 novel coronavirus disease (COVID-19)  Z86.16     3. DOE (dyspnea on exertion)  R06.09     4. Pulmonary air trapping  R09.89          Plan:     Patient Instructions     ICD-10-CM   1. History of smoking  Z87.891     2. History of 2019 novel coronavirus disease (COVID-19)  Z86.16     3. DOE (dyspnea on exertion)  R06.09     4. Pulmonary air trapping  R09.89        PFT test normal Radiologist does not think you have ILD There is some air trappign which can in part explain your shortness of breath  - not sure why you have this; coould be seen in asthma Weight also contributing to shortness of breath Glad wellbutrin working better for you  Plan  - CMA will remove ILD from problem list  - try symbicort 80/4,5  - 2 puff twice daily scheduled for a month and see if this helps  - if so call us - use albuterol as needed - join exercise program and encourage weight loss  Followup   -  return in 1  year or sooner if needed    SIGNATURE    Dr. Brand Males, M.D., F.C.C.P,  Pulmonary and Critical Care Medicine Staff Physician, [place] Director - Interstitial Lung Disease  Program  P[facility] [place] [facility] [place], [place], [address]  Pager: [phone], If no answer or between  15:00h - 7:00h: call [phone] Telephone: [phone]  2:27 PM 02/26/2021

## 2021-02-26 NOTE — Patient Instructions (Addendum)
ICD-10-CM   1. History of smoking  Z87.891     2. History of 2019 novel coronavirus disease (COVID-19)  Z86.16     3. DOE (dyspnea on exertion)  R06.09     4. Pulmonary air trapping  R09.89        PFT test normal Radiologist does not think you have ILD There is some air trappign which can in part explain your shortness of breath  - not sure why you have this; coould be seen in asthma Weight also contributing to shortness of breath Glad wellbutrin working better for you  Plan  - CMA will remove ILD from problem list  - try symbicort 80/4,5  - 2 puff twice daily scheduled for a month and see if this helps  - if so call us - use albuterol as needed - join exercise program and encourage weight loss  Followup  - return in 1  year or sooner if needed

## 2021-03-03 ENCOUNTER — Ambulatory Visit
Admission: RE | Admit: 2021-03-03 | Discharge: 2021-03-03 | Disposition: A | Discharge status: Home / Self Care | Payer: Medicare PPO | Source: Ambulatory Visit | Attending: Obstetrics and Gynecology | Admitting: Obstetrics and Gynecology

## 2021-03-03 DIAGNOSIS — N6320 Unspecified lump in the left breast, unspecified quadrant: Secondary | ICD-10-CM

## 2021-04-21 ENCOUNTER — Other Ambulatory Visit: Payer: Self-pay | Admitting: General Practice

## 2021-04-21 DIAGNOSIS — M26629 Arthralgia of temporomandibular joint, unspecified side: Secondary | ICD-10-CM

## 2021-04-21 DIAGNOSIS — G8929 Other chronic pain: Secondary | ICD-10-CM

## 2021-04-24 ENCOUNTER — Ambulatory Visit
Admission: RE | Admit: 2021-04-24 | Discharge: 2021-04-24 | Disposition: A | Discharge status: Home / Self Care | Payer: Medicare PPO | Source: Ambulatory Visit | Attending: General Practice | Admitting: General Practice

## 2021-04-24 DIAGNOSIS — G8929 Other chronic pain: Secondary | ICD-10-CM

## 2021-04-24 DIAGNOSIS — S0301XA Dislocation of jaw, right side, initial encounter: Secondary | ICD-10-CM | POA: Diagnosis not present

## 2021-04-27 DIAGNOSIS — W57XXXA Bitten or stung by nonvenomous insect and other nonvenomous arthropods, initial encounter: Secondary | ICD-10-CM | POA: Diagnosis not present

## 2021-04-27 DIAGNOSIS — S30866A Insect bite (nonvenomous) of unspecified external genital organs, female, initial encounter: Secondary | ICD-10-CM | POA: Diagnosis not present

## 2021-04-27 DIAGNOSIS — I10 Essential (primary) hypertension: Secondary | ICD-10-CM | POA: Diagnosis not present

## 2021-04-29 DIAGNOSIS — M25512 Pain in left shoulder: Secondary | ICD-10-CM | POA: Diagnosis not present

## 2021-04-30 DIAGNOSIS — K083 Retained dental root: Secondary | ICD-10-CM | POA: Diagnosis not present

## 2021-05-11 DIAGNOSIS — M542 Cervicalgia: Secondary | ICD-10-CM | POA: Diagnosis not present

## 2021-05-11 DIAGNOSIS — R6884 Jaw pain: Secondary | ICD-10-CM | POA: Diagnosis not present

## 2021-05-11 DIAGNOSIS — M25512 Pain in left shoulder: Secondary | ICD-10-CM | POA: Diagnosis not present

## 2021-05-15 DIAGNOSIS — L821 Other seborrheic keratosis: Secondary | ICD-10-CM | POA: Diagnosis not present

## 2021-05-15 DIAGNOSIS — Z1283 Encounter for screening for malignant neoplasm of skin: Secondary | ICD-10-CM | POA: Diagnosis not present

## 2021-05-15 DIAGNOSIS — D225 Melanocytic nevi of trunk: Secondary | ICD-10-CM | POA: Diagnosis not present

## 2021-05-15 DIAGNOSIS — X32XXXD Exposure to sunlight, subsequent encounter: Secondary | ICD-10-CM | POA: Diagnosis not present

## 2021-05-15 DIAGNOSIS — L570 Actinic keratosis: Secondary | ICD-10-CM | POA: Diagnosis not present

## 2021-05-18 DIAGNOSIS — R6884 Jaw pain: Secondary | ICD-10-CM | POA: Diagnosis not present

## 2021-05-18 DIAGNOSIS — M25512 Pain in left shoulder: Secondary | ICD-10-CM | POA: Diagnosis not present

## 2021-05-18 DIAGNOSIS — M542 Cervicalgia: Secondary | ICD-10-CM | POA: Diagnosis not present

## 2021-05-19 DIAGNOSIS — M25512 Pain in left shoulder: Secondary | ICD-10-CM | POA: Diagnosis not present

## 2021-05-19 DIAGNOSIS — E1169 Type 2 diabetes mellitus with other specified complication: Secondary | ICD-10-CM | POA: Diagnosis not present

## 2021-05-19 DIAGNOSIS — E039 Hypothyroidism, unspecified: Secondary | ICD-10-CM | POA: Diagnosis not present

## 2021-05-19 DIAGNOSIS — I10 Essential (primary) hypertension: Secondary | ICD-10-CM | POA: Diagnosis not present

## 2021-05-19 DIAGNOSIS — Z0000 Encounter for general adult medical examination without abnormal findings: Secondary | ICD-10-CM | POA: Diagnosis not present

## 2021-05-19 DIAGNOSIS — E559 Vitamin D deficiency, unspecified: Secondary | ICD-10-CM | POA: Diagnosis not present

## 2021-05-19 DIAGNOSIS — R7303 Prediabetes: Secondary | ICD-10-CM | POA: Diagnosis not present

## 2021-05-19 DIAGNOSIS — J302 Other seasonal allergic rhinitis: Secondary | ICD-10-CM | POA: Diagnosis not present

## 2021-05-19 DIAGNOSIS — E669 Obesity, unspecified: Secondary | ICD-10-CM | POA: Diagnosis not present

## 2021-05-21 DIAGNOSIS — R6884 Jaw pain: Secondary | ICD-10-CM | POA: Diagnosis not present

## 2021-05-21 DIAGNOSIS — M542 Cervicalgia: Secondary | ICD-10-CM | POA: Diagnosis not present

## 2021-05-21 DIAGNOSIS — M25512 Pain in left shoulder: Secondary | ICD-10-CM | POA: Diagnosis not present

## 2021-05-28 DIAGNOSIS — M25512 Pain in left shoulder: Secondary | ICD-10-CM | POA: Diagnosis not present

## 2021-05-28 DIAGNOSIS — R6884 Jaw pain: Secondary | ICD-10-CM | POA: Diagnosis not present

## 2021-05-28 DIAGNOSIS — M542 Cervicalgia: Secondary | ICD-10-CM | POA: Diagnosis not present

## 2021-06-04 DIAGNOSIS — M542 Cervicalgia: Secondary | ICD-10-CM | POA: Diagnosis not present

## 2021-06-04 DIAGNOSIS — R6884 Jaw pain: Secondary | ICD-10-CM | POA: Diagnosis not present

## 2021-06-04 DIAGNOSIS — M25512 Pain in left shoulder: Secondary | ICD-10-CM | POA: Diagnosis not present

## 2021-06-08 DIAGNOSIS — M25512 Pain in left shoulder: Secondary | ICD-10-CM | POA: Diagnosis not present

## 2021-06-09 DIAGNOSIS — M542 Cervicalgia: Secondary | ICD-10-CM | POA: Diagnosis not present

## 2021-06-09 DIAGNOSIS — R6884 Jaw pain: Secondary | ICD-10-CM | POA: Diagnosis not present

## 2021-06-09 DIAGNOSIS — M25512 Pain in left shoulder: Secondary | ICD-10-CM | POA: Diagnosis not present

## 2021-06-17 DIAGNOSIS — M25512 Pain in left shoulder: Secondary | ICD-10-CM | POA: Diagnosis not present

## 2021-06-17 DIAGNOSIS — R6884 Jaw pain: Secondary | ICD-10-CM | POA: Diagnosis not present

## 2021-06-17 DIAGNOSIS — M542 Cervicalgia: Secondary | ICD-10-CM | POA: Diagnosis not present

## 2021-06-24 DIAGNOSIS — R6884 Jaw pain: Secondary | ICD-10-CM | POA: Diagnosis not present

## 2021-06-24 DIAGNOSIS — M542 Cervicalgia: Secondary | ICD-10-CM | POA: Diagnosis not present

## 2021-06-24 DIAGNOSIS — M25512 Pain in left shoulder: Secondary | ICD-10-CM | POA: Diagnosis not present

## 2021-06-29 ENCOUNTER — Ambulatory Visit: Payer: Medicare PPO | Admitting: Internal Medicine

## 2021-07-01 DIAGNOSIS — M25512 Pain in left shoulder: Secondary | ICD-10-CM | POA: Diagnosis not present

## 2021-07-01 DIAGNOSIS — M542 Cervicalgia: Secondary | ICD-10-CM | POA: Diagnosis not present

## 2021-07-01 DIAGNOSIS — R6884 Jaw pain: Secondary | ICD-10-CM | POA: Diagnosis not present

## 2021-07-08 DIAGNOSIS — M1711 Unilateral primary osteoarthritis, right knee: Secondary | ICD-10-CM | POA: Diagnosis not present

## 2021-07-08 DIAGNOSIS — R6884 Jaw pain: Secondary | ICD-10-CM | POA: Diagnosis not present

## 2021-07-08 DIAGNOSIS — M542 Cervicalgia: Secondary | ICD-10-CM | POA: Diagnosis not present

## 2021-07-08 DIAGNOSIS — M25512 Pain in left shoulder: Secondary | ICD-10-CM | POA: Diagnosis not present

## 2021-07-15 DIAGNOSIS — M542 Cervicalgia: Secondary | ICD-10-CM | POA: Diagnosis not present

## 2021-07-15 DIAGNOSIS — M25512 Pain in left shoulder: Secondary | ICD-10-CM | POA: Diagnosis not present

## 2021-07-15 DIAGNOSIS — R6884 Jaw pain: Secondary | ICD-10-CM | POA: Diagnosis not present

## 2021-08-12 ENCOUNTER — Encounter: Payer: Self-pay | Admitting: Internal Medicine

## 2021-08-12 ENCOUNTER — Ambulatory Visit: Payer: Medicare PPO | Admitting: Internal Medicine

## 2021-08-12 VITALS — BP 104/72 | HR 94 | Ht 63.0 in | Wt 209.0 lb

## 2021-08-12 DIAGNOSIS — K644 Residual hemorrhoidal skin tags: Secondary | ICD-10-CM | POA: Diagnosis not present

## 2021-08-12 DIAGNOSIS — Z860101 Personal history of adenomatous and serrated colon polyps: Secondary | ICD-10-CM

## 2021-08-12 DIAGNOSIS — K648 Other hemorrhoids: Secondary | ICD-10-CM

## 2021-08-12 DIAGNOSIS — Z86010 Personal history of colonic polyps: Secondary | ICD-10-CM | POA: Diagnosis not present

## 2021-08-12 NOTE — Progress Notes (Signed)
MAKARA LANZO 71 y.o. [birth year] [id number]  Assessment & Plan:   Encounter Diagnoses  Name Primary?   Hx of adenomatous polyp of colon Yes   Internal and external bleeding hemorrhoids    Schedule colonoscopy for polyp surveillance.  Reexamine hemorrhoids then.  Banding of internal hemorrhoids may help but she has prominent external hemorrhoids at this time as well which sometimes improved with banding of internal hemorrhoids but my suspicion is these may not.  Surgical referral would be considered.  Continue stool softener and topical treatment at this time.  She has reviewed the informed consent piece that outlines the risks benefits and indications of the procedures.   I appreciate the opportunity to care for this patient. CC: Velna Hatchet, MD   Subjective:   Chief Complaint: Hemorrhoids and bleeding  HPI 71 year old woman with a history of bleeding and prolapsing internal hemorrhoids treated with banding most recently in 2020.  Subcentimeter adenoma removed 2016 original recommendation for repeat colonoscopy 2021 now would be 2023.  She has not had a surveillance colonoscopy.  She had to take antibiotics for a dental abscess in the winter.  Long course that she got constipated with those and her hemorrhoids started flaring again.  She feels like she has had increased gas both from above and below since then.  She will see bright red blood per rectum with wiping and in the toilet bowl and even some spraying of blood when she passes flatus.  Sometimes the blood is a bit darker.  Stool softeners are keeping her bowel habits regular.  Reminds me that she took Optavia weight loss treatment a number of years ago and that caused irregular bowel habits that she thinks some of which persists.  Hemoglobin 13.27 October 2020.  She is using intermittent hydrocortisone cream or suppository with some benefit but has had persistent symptoms and bleeds 3 times or more a  week.   Review of systems asthma issues earlier this year saw pulmonology question of interstitial lung disease CT was negative for that she does have a gallstone but no signs or symptoms.  No upper GI symptoms other than belching reported.  Allergies  Allergen Reactions   Doxycycline Swelling   Augmentin [Amoxicillin-Pot Clavulanate] Nausea And Vomiting   Hydrocodone Other (See Comments)    Eyes would cross with this    Influenza Vaccines Other (See Comments)    Fever, severe flu symptoms   Lorazepam Hives   Macrodantin [Nitrofurantoin Macrocrystal] Hives   Penicillins Other (See Comments)    As child    Pristiq [Desvenlafaxine] Other (See Comments)    Fever and legs stiff, couldn't ambulate    Sulfa Antibiotics Rash   Current Meds  Medication Sig   betamethasone valerate (VALISONE) 0.1 % cream Apply 1 application topically daily as needed (for skin irritation).    budesonide-formoterol (SYMBICORT) 80-4.5 MCG/ACT inhaler Inhale 2 puffs into the lungs in the morning and at bedtime.   buPROPion (WELLBUTRIN XL) 150 MG 24 hr tablet Take 150 mg by mouth daily.   Chlorphen-PE-Acetaminophen (CORICIDIN D COLD/FLU/SINUS PO) Take 1 tablet by mouth daily as needed (for allergy).   cholecalciferol (VITAMIN D) 1000 units tablet Take 2,000 Units by mouth daily.   estradiol (ESTRACE) 0.1 MG/GM vaginal cream Place 1 Applicatorful vaginally once a week.   hydrocortisone (ANUSOL-HC) 2.5 % rectal cream APPLY RECTALLY TWICE A DAY   hydrocortisone (ANUSOL-HC) 25 MG suppository Place 25 mg rectally 2 (two) times daily.   ibuprofen (ADVIL) 100 MG  tablet Take 200 mg by mouth every 6 (six) hours as needed for pain or fever.   levothyroxine (SYNTHROID, LEVOTHROID) 75 MCG tablet Take 75 mcg by mouth daily before breakfast.   meloxicam (MOBIC) 15 MG tablet Take 15 mg by mouth daily.   olmesartan (BENICAR) 40 MG tablet olmesartan 40 mg tablet  TAKE 1 TABLET BY MOUTH EVERY DAY FOR 90 DAYS   Omega-3 Fatty  Acids (FISH OIL) 1000 MG CAPS Take 2,000 mg by mouth daily.   Past Medical History:  Diagnosis Date   Allergy    seasonal   Anal skin tags 10/18/2016   Arthritis    right knee- cortisone injections   Cancer (HCC)    skin cancer face, back    Chicken pox    Depression    Endometriosis    External hemorrhoid    H/O bladder infections    as child/teen   Hx of adenomatous polyp of colon 04/24/2014   Hypertension    Hypothyroidism    ILD (interstitial lung disease) (HCC)    Internal hemorrhoid    Pneumonia    Prolapsed internal hemorrhoids, grade 2 10/18/2016   Vaginal delivery    Past Surgical History:  Procedure Laterality Date   CARDIAC CATHETERIZATION     all normal    COLONOSCOPY W/ POLYPECTOMY     HEMORRHOID BANDING     TOTAL KNEE ARTHROPLASTY Left 2012   wisdom  teeth     no sedation with this, just novacaine   Social History   Social History Narrative   Divorced retired Education officer, 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   Review of Systems As per HPI otherwise negative  Objective:   Physical Exam '@BP'$  104/72   Pulse 94   Ht '5\' 3"'$  (1.6 m)   Wt 209 lb (94.8 kg)   BMI 37.02 kg/m @  General:  Well-developed, well-nourished and in no acute distress, she is obese Eyes:  anicteric. Lungs: Clear to auscultation bilaterally. Heart:   S1S2, no rubs, murmurs, gallops. Abdomen: Obese, soft, non-tender, no hepatosplenomegaly, hernia, or mass and BS+.  Rectal:  Patti [place], [place] present. Rectal exam reveals inflamed prolapsed  external hemorrhoids in all 3 positions.  Grade 3-4.  Anal canal is elongated and somewhat stenotic with hemorrhoids.  Soft brown stool present.  Anoscopy is performed and shows the same with grade 1 internal hemorrhoids in all positions though stool in the rectum limits the exam slightly.  Neuro:  A&O x 3.  Psych:  appropriate mood and  Affect.   Data Reviewed:  See HPI

## 2021-08-12 NOTE — Patient Instructions (Signed)
You have been scheduled for a colonoscopy. Please follow written instructions given to you at your visit today.  Please pick up your prep supplies at the pharmacy within the next 1-3 days. If you use inhalers (even only as needed), please bring them with you on the day of your procedure.   I appreciate the opportunity to care for you. Carl Gessner, MD, FACG 

## 2021-08-17 DIAGNOSIS — E1169 Type 2 diabetes mellitus with other specified complication: Secondary | ICD-10-CM | POA: Diagnosis not present

## 2021-08-17 DIAGNOSIS — I10 Essential (primary) hypertension: Secondary | ICD-10-CM | POA: Diagnosis not present

## 2021-08-17 DIAGNOSIS — I700 Atherosclerosis of aorta: Secondary | ICD-10-CM | POA: Diagnosis not present

## 2021-08-19 DIAGNOSIS — M7502 Adhesive capsulitis of left shoulder: Secondary | ICD-10-CM | POA: Diagnosis not present

## 2021-08-19 DIAGNOSIS — M1711 Unilateral primary osteoarthritis, right knee: Secondary | ICD-10-CM | POA: Diagnosis not present

## 2021-09-07 ENCOUNTER — Ambulatory Visit (AMBULATORY_SURGERY_CENTER): Payer: Medicare PPO | Admitting: Internal Medicine

## 2021-09-07 ENCOUNTER — Encounter: Payer: Self-pay | Admitting: Internal Medicine

## 2021-09-07 VITALS — BP 110/62 | HR 78 | Temp 97.8°F | Resp 14 | Ht 63.0 in | Wt 209.0 lb

## 2021-09-07 DIAGNOSIS — Z09 Encounter for follow-up examination after completed treatment for conditions other than malignant neoplasm: Secondary | ICD-10-CM

## 2021-09-07 DIAGNOSIS — Z86010 Personal history of colonic polyps: Secondary | ICD-10-CM | POA: Diagnosis not present

## 2021-09-07 DIAGNOSIS — I10 Essential (primary) hypertension: Secondary | ICD-10-CM | POA: Diagnosis not present

## 2021-09-07 MED ORDER — SODIUM CHLORIDE 0.9 % IV SOLN: 500.0000 mL | Freq: Once | INTRAVENOUS | Status: DC

## 2021-09-07 NOTE — Op Note (Signed)
[facility] Patient Name: Stacy Tucker Procedure Date: 09/07/2021 12:31 PM MRN: [id number] Endoscopist: Gatha Mayer , MD Age: 71 Referring MD:  Date of Birth: 04/28/1950 Gender: Female Account #: [id number] Procedure:                Colonoscopy Indications:              Surveillance: Personal history of adenomatous                            polyps on last colonoscopy > 5 years ago, Last                            colonoscopy: 2016 Medicines:                Monitored Anesthesia Care Procedure:                Pre-Anesthesia Assessment:                           - Prior to the procedure, a History and Physical                            was performed, and patient medications and                            allergies were reviewed. The patient's tolerance of                            previous anesthesia was also reviewed. The risks                            and benefits of the procedure and the sedation                            options and risks were discussed with the patient.                            All questions were answered, and informed consent                            was obtained. Prior Anticoagulants: The patient has                            taken no previous anticoagulant or antiplatelet                            agents. ASA Grade Assessment: II - A patient with                            mild systemic disease. After reviewing the risks                            and benefits, the patient was deemed in  satisfactory condition to undergo the procedure.                           After obtaining informed consent, the colonoscope                            was passed under direct vision. Throughout the                            procedure, the patient's blood pressure, pulse, and                            oxygen saturations were monitored continuously. The                            Olympus [id number] [id number]) Colonoscope was                             introduced through the anus and advanced to the the                            cecum, identified by appendiceal orifice and                            ileocecal valve. The colonoscopy was performed                            without difficulty. The patient tolerated the                            procedure well. The quality of the bowel                            preparation was good. The ileocecal valve,                            appendiceal orifice, and rectum were photographed. Scope In: 1:27:50 PM Scope Out: 1:44:16 PM Scope Withdrawal Time: 0 hours 11 minutes 30 seconds  Total Procedure Duration: 0 hours 16 minutes 26 seconds  Findings:                 Hemorrhoids were found on perianal exam.                           External and internal hemorrhoids were found.                           Multiple small and large-mouthed diverticula were                            found in the sigmoid colon. There was narrowing of                            the colon in association with the diverticular  opening.                           The exam was otherwise without abnormality on                            direct and retroflexion views. Complications:            No immediate complications. Estimated Blood Loss:     Estimated blood loss: none. Impression:               - Hemorrhoids found on perianal exam.                           - External and internal hemorrhoids. Large                            externals, some internal component but less                            prominent                           - Severe diverticulosis in the sigmoid colon. There                            was narrowing of the colon in association with the                            diverticular opening.                           - The examination was otherwise normal on direct                            and retroflexion views.                           - No  specimens collected.                           - Personal history of colonic polyp - subcm adenoma                            2016. Recommendation:           - Patient has a contact number available for                            emergencies. The signs and symptoms of potential                            delayed complications were discussed with the                            patient. Return to normal activities tomorrow.  Written discharge instructions were provided to the                            patient.                           - Resume previous diet.                           - Continue present medications.                           - No repeat colonoscopy due to current age (61                            years or older) and the absence of colonic polyps.                           - MY OFFICE WILL REFER TO [facility]                            RE: SYMPTOMATIC INTERNAL/EXTERNAL HEMORRHOIDS -                            PLEASE EVALUATE AND TREAT Gatha Mayer, MD 09/07/2021 1:56:50 PM This report has been signed electronically.

## 2021-09-07 NOTE — Progress Notes (Signed)
History and Physical Interval Note:  09/07/2021 1:21 PM  Stacy Tucker  has presented today for endoscopic procedure(s), with the diagnosis of  Encounter Diagnosis  Name Primary?   Personal history of colonic polyps Yes  .  The various methods of evaluation and treatment have been discussed with the patient and/or family. After consideration of risks, benefits and other options for treatment, the patient has consented to  the endoscopic procedure(s).   The patient's history has been reviewed, patient examined, no change in status, stable for endoscopic procedure(s).  I have reviewed the patient's chart and labs.  Questions were answered to the patient's satisfaction.     Gatha Mayer, MD, [facility]

## 2021-09-07 NOTE — Progress Notes (Signed)
Pt's states no medical or surgical changes since previsit or office visit.   Vs by CW in adm

## 2021-09-07 NOTE — Progress Notes (Signed)
Report to PACU, RN, vss, BBS= Clear.  

## 2021-09-07 NOTE — Patient Instructions (Addendum)
No polyps found today. No need for any further routine colonoscopy.  Hemorrhoids are both internal and external and I am going to refer you to surgery to see what they think about banding vs surgery.  I appreciate the opportunity to care for you. Gatha Mayer, MD, [facility]  Handout provided on diverticulosis, hemorrhoids and hemorrhoid banding.   YOU HAD AN ENDOSCOPIC PROCEDURE TODAY AT [facility]:   Refer to the procedure report that was given to you for any specific questions about what was found during the examination.  If the procedure report does not answer your questions, please call your gastroenterologist to clarify.  If you requested that your care partner not be given the details of your procedure findings, then the procedure report has been included in a sealed envelope for you to review at your convenience later.  YOU SHOULD EXPECT: Some feelings of bloating in the abdomen. Passage of more gas than usual.  Walking can help get rid of the air that was put into your GI tract during the procedure and reduce the bloating. If you had a lower endoscopy (such as a colonoscopy or flexible sigmoidoscopy) you may notice spotting of blood in your stool or on the toilet paper. If you underwent a bowel prep for your procedure, you may not have a normal bowel movement for a few days.  Please Note:  You might notice some irritation and congestion in your nose or some drainage.  This is from the oxygen used during your procedure.  There is no need for concern and it should clear up in a day or so.  SYMPTOMS TO REPORT IMMEDIATELY:  Following lower endoscopy (colonoscopy or flexible sigmoidoscopy):  Excessive amounts of blood in the stool  Significant tenderness or worsening of abdominal pains  Swelling of the abdomen that is new, acute  Fever of 100F or higher  For urgent or emergent issues, a gastroenterologist can be reached at any hour by calling [phone]. Do not use  MyChart messaging for urgent concerns.    DIET:  We do recommend a small meal at first, but then you may proceed to your regular diet.  Drink plenty of fluids but you should avoid alcoholic beverages for 24 hours.  ACTIVITY:  You should plan to take it easy for the rest of today and you should NOT DRIVE or use heavy machinery until tomorrow (because of the sedation medicines used during the test).    FOLLOW UP: Our staff will call the number listed on your records the next business day following your procedure.  We will call around 7:15- 8:00 am to check on you and address any questions or concerns that you may have regarding the information given to you following your procedure. If we do not reach you, we will leave a message.  If you develop any symptoms (ie: fever, flu-like symptoms, shortness of breath, cough etc.) before then, please call [phone].  If you test positive for Covid 19 in the 2 weeks post procedure, please call and report this information to [place].    If any biopsies were taken you will be contacted by phone or by letter within the next 1-3 weeks.  Please call us at [phone] if you have not heard about the biopsies in 3 weeks.    SIGNATURES/CONFIDENTIALITY: You and/or your care partner have signed paperwork which will be entered into your electronic medical record.  These signatures attest to the fact that that the information above  on your After Visit Summary has been reviewed and is understood.  Full responsibility of the confidentiality of this discharge information lies with you and/or your care-partner.

## 2021-09-08 ENCOUNTER — Telehealth: Payer: Self-pay

## 2021-09-08 NOTE — Telephone Encounter (Signed)
  Follow up Call-     09/07/2021   12:42 PM  Call back number  Post procedure Call Back phone  # [phone]  Permission to leave phone message Yes     Patient questions:  Do you have a fever, pain , or abdominal swelling? No. Pain Score  0 *  Have you tolerated food without any problems? Yes.    Have you been able to return to your normal activities? Yes.    Do you have any questions about your discharge instructions: Diet   No. Medications  No. Follow up visit  No.  Do you have questions or concerns about your Care? No.  Actions: * If pain score is 4 or above: No action needed, pain <4.

## 2021-09-08 NOTE — Telephone Encounter (Signed)
-----   Message from Gatha Mayer, MD sent at 09/07/2021  4:27 PM EDT ----- Regarding: [facility] - referral re: hemorrhoids I did not specify in the colonoscopy report from today but ask for Dr. Marcello Moores or [facility] (colorectal surgeons)

## 2021-09-08 NOTE — Telephone Encounter (Signed)
Referral sent to [facility] Dr. Marcello Moores or [facility] along with pt records:  Left message for pt to call back

## 2021-09-08 NOTE — Telephone Encounter (Signed)
Pt was made aware that Referral was sent to [facility] Dr. Marcello Moores or [facility] along with pt records:  Pt was notified if she has not heard anything from them in two weeks to give our office a call back: Pt verbalized understanding with all questions answered.

## 2021-10-09 DIAGNOSIS — M1711 Unilateral primary osteoarthritis, right knee: Secondary | ICD-10-CM | POA: Diagnosis not present

## 2021-10-12 DIAGNOSIS — K642 Third degree hemorrhoids: Secondary | ICD-10-CM | POA: Diagnosis not present

## 2021-10-16 DIAGNOSIS — M1711 Unilateral primary osteoarthritis, right knee: Secondary | ICD-10-CM | POA: Diagnosis not present

## 2021-10-23 DIAGNOSIS — M1711 Unilateral primary osteoarthritis, right knee: Secondary | ICD-10-CM | POA: Diagnosis not present

## 2021-11-06 DIAGNOSIS — E039 Hypothyroidism, unspecified: Secondary | ICD-10-CM | POA: Diagnosis not present

## 2021-11-06 DIAGNOSIS — E559 Vitamin D deficiency, unspecified: Secondary | ICD-10-CM | POA: Diagnosis not present

## 2021-11-06 DIAGNOSIS — I10 Essential (primary) hypertension: Secondary | ICD-10-CM | POA: Diagnosis not present

## 2021-11-06 DIAGNOSIS — F419 Anxiety disorder, unspecified: Secondary | ICD-10-CM | POA: Diagnosis not present

## 2021-11-06 DIAGNOSIS — R7303 Prediabetes: Secondary | ICD-10-CM | POA: Diagnosis not present

## 2021-11-18 ENCOUNTER — Other Ambulatory Visit: Payer: Self-pay | Admitting: Internal Medicine

## 2021-11-18 DIAGNOSIS — E559 Vitamin D deficiency, unspecified: Secondary | ICD-10-CM | POA: Diagnosis not present

## 2021-11-18 DIAGNOSIS — E1169 Type 2 diabetes mellitus with other specified complication: Secondary | ICD-10-CM | POA: Diagnosis not present

## 2021-11-18 DIAGNOSIS — R82998 Other abnormal findings in urine: Secondary | ICD-10-CM | POA: Diagnosis not present

## 2021-11-18 DIAGNOSIS — Z0000 Encounter for general adult medical examination without abnormal findings: Secondary | ICD-10-CM | POA: Diagnosis not present

## 2021-11-18 DIAGNOSIS — G4700 Insomnia, unspecified: Secondary | ICD-10-CM | POA: Diagnosis not present

## 2021-11-18 DIAGNOSIS — I700 Atherosclerosis of aorta: Secondary | ICD-10-CM | POA: Diagnosis not present

## 2021-11-18 DIAGNOSIS — J302 Other seasonal allergic rhinitis: Secondary | ICD-10-CM | POA: Diagnosis not present

## 2021-11-18 DIAGNOSIS — I10 Essential (primary) hypertension: Secondary | ICD-10-CM | POA: Diagnosis not present

## 2021-11-18 DIAGNOSIS — M5430 Sciatica, unspecified side: Secondary | ICD-10-CM | POA: Diagnosis not present

## 2021-11-26 ENCOUNTER — Other Ambulatory Visit: Payer: Self-pay | Admitting: *Deleted

## 2021-11-26 MED ORDER — BUDESONIDE-FORMOTEROL FUMARATE 80-4.5 MCG/ACT IN AERO
2.0000 | INHALATION_SPRAY | Freq: Two times a day (BID) | RESPIRATORY_TRACT | 3 refills | Status: DC
Start: 2021-11-26 — End: 2022-03-15

## 2021-12-14 ENCOUNTER — Ambulatory Visit
Admission: RE | Admit: 2021-12-14 | Discharge: 2021-12-14 | Disposition: A | Discharge status: Home / Self Care | Payer: Medicare PPO | Source: Ambulatory Visit | Attending: Internal Medicine | Admitting: Internal Medicine

## 2021-12-14 DIAGNOSIS — Z0000 Encounter for general adult medical examination without abnormal findings: Secondary | ICD-10-CM

## 2021-12-14 DIAGNOSIS — Z87891 Personal history of nicotine dependence: Secondary | ICD-10-CM | POA: Diagnosis not present

## 2021-12-14 DIAGNOSIS — M1711 Unilateral primary osteoarthritis, right knee: Secondary | ICD-10-CM | POA: Diagnosis not present

## 2021-12-14 DIAGNOSIS — M5430 Sciatica, unspecified side: Secondary | ICD-10-CM | POA: Diagnosis not present

## 2021-12-25 DIAGNOSIS — M5430 Sciatica, unspecified side: Secondary | ICD-10-CM | POA: Diagnosis not present

## 2021-12-25 DIAGNOSIS — M1711 Unilateral primary osteoarthritis, right knee: Secondary | ICD-10-CM | POA: Diagnosis not present

## 2021-12-28 DIAGNOSIS — M1711 Unilateral primary osteoarthritis, right knee: Secondary | ICD-10-CM | POA: Diagnosis not present

## 2021-12-28 DIAGNOSIS — M5430 Sciatica, unspecified side: Secondary | ICD-10-CM | POA: Diagnosis not present

## 2022-03-15 ENCOUNTER — Ambulatory Visit: Payer: Medicare PPO | Admitting: Internal Medicine

## 2022-03-15 ENCOUNTER — Encounter: Payer: Self-pay | Admitting: Internal Medicine

## 2022-03-15 VITALS — BP 124/70 | HR 73 | Ht 64.0 in | Wt 213.2 lb

## 2022-03-15 DIAGNOSIS — J4530 Mild persistent asthma, uncomplicated: Secondary | ICD-10-CM | POA: Diagnosis not present

## 2022-03-15 DIAGNOSIS — J309 Allergic rhinitis, unspecified: Secondary | ICD-10-CM | POA: Diagnosis not present

## 2022-03-15 DIAGNOSIS — H353131 Nonexudative age-related macular degeneration, bilateral, early dry stage: Secondary | ICD-10-CM | POA: Diagnosis not present

## 2022-03-15 DIAGNOSIS — R0609 Other forms of dyspnea: Secondary | ICD-10-CM

## 2022-03-15 DIAGNOSIS — R0989 Other specified symptoms and signs involving the circulatory and respiratory systems: Secondary | ICD-10-CM | POA: Diagnosis not present

## 2022-03-15 DIAGNOSIS — H2513 Age-related nuclear cataract, bilateral: Secondary | ICD-10-CM | POA: Diagnosis not present

## 2022-03-15 MED ORDER — ALBUTEROL SULFATE HFA 108 (90 BASE) MCG/ACT IN AERS: INHALATION_SPRAY | RESPIRATORY_TRACT | 4 refills | Status: AC

## 2022-03-15 MED ORDER — BUDESONIDE-FORMOTEROL FUMARATE 80-4.5 MCG/ACT IN AERO: 2.0000 | INHALATION_SPRAY | Freq: Two times a day (BID) | RESPIRATORY_TRACT | 11 refills | Status: DC

## 2022-03-15 NOTE — Patient Instructions (Addendum)
ICD-10-CM   1. Pulmonary air trapping  R09.89     2. DOE (dyspnea on exertion)  R06.09     3. Mild persistent asthma without complication  X28.11     4. Allergic rhinitis, unspecified seasonality, unspecified trigger  J30.9      Pulmonary air trapping DOE (dyspnea on exertion) Mild persistent asthma without complication   - clinical behavior is one of low grade persistent asthma; glad you are better with symbicort  plan -Continue Symbicort 80/4.5 at 2 puffs twice daily for another year and then we can reassess about tapering it down  -[facility] to send refill -Use albuterol as needed   Allergic rhinitis, unspecified seasonality, unspecified trigger  Plan  - I would recommend starting with saline nasal spray and if this does not work you can use an antihistamine pill [over-the-counter allergy tablet] -If symptoms are out of control then we can always check blood for RAST allergy test in the future as needed   Followup  - return in 1  year or sooner if needed

## 2022-03-15 NOTE — Progress Notes (Signed)
OV 12/18/2020 - new consult Subjective:  Patient ID: Stacy Tucker, female , DOB: 14-Jul-1950 , age 72 y.o. , MRN: [id number] , ADDRESS: [place] [address] PCP Stacy Hatchet, MD Patient Care Team: Stacy Hatchet, MD as PCP - General (Internal Medicine) Stacy Klein, MD as Consulting Physician (Cardiology)  This Provider for this visit: Treatment Team:  Attending Provider: Brand Males, MD    12/18/2020 -   Chief Complaint  Patient presents with   Consult    Pt states she is a former smoker and imaging showed ILD. Pt states that she does have complaints of SOB with exertion that is also happening when sleeping. Denies any complaints of cough, wheezing, or chest discomfort.     HPI Stacy Tucker 72 y.o. -presents with her only daughter Stacy Tucker.  She is a former Education officer, museum.  This concern for interstitial lung disease.  She is naturally nervous about this.  She tells me that she used to smoke and she quit.  She had a low-dose CT scan of the chest 2 years ago and she was reassured [in my personal visualization she has early interstitial lung abnormalities even 2 years ago and I showed this to her].  That she had a repeat low-dose CT scan and this is now more pronounced [I personally visualized this and I do agree with that] and therefore she is being referred here.  She says that she did had COVID in the interim particularly around June 2022 with high fever.  This is an outpatient treatment.  Nevertheless since 2016 she feels she said insidious worsening of dyspnea in onset.  She is overweight.  She is attributed some of the dyspnea due to that.  Details of her interstitial lung disease questionnaire are listed below    [facility] Comprehensive ILD Questionnaire  Symptoms:      Past Medical History :  -She had allergy skin test around 2016. She says she is allergic to most plants and grasses this has been going on for several  decades - She has chronic arthritis.  Early morning she is actually better it gets worse through the course of the day.  Is been going on for 20 years.  No formal diagnosis of rheumatoid arthritis - She has had a left knee replacement.  She says she needs a right knee replacement - She does have hypertension - She is not diabetic but her hemoglobin A1c is slightly high according to history - She does take Synthroid - She will issue might of had pneumonia in the distant past - Did have COVID in June 2022 with 2 days of high fever no respiratory problems - she is history of tick bite in summer 2021 - - summer 2022 - spider bite in the last 1 year or so. - Measles , chicken pox, strep throat - 1997 asthma attack - wlkig pneumonia several times in pst years - 2016: had it for 3 months   ROS:  -Has arthralgia for 20 years.  Also has some fatigue. - Has some acid reflux symptoms on and off - Does do some snoring  FAMILY HISTORY of LUNG DISEASE:   -Her grandmother had deformed fingers as she aged.?  Rheumatoid arthritis but she is not sure - Her brother had childhood asthma - Otherwise negative  PERSONAL EXPOSURE HISTORY:   -He smokes cigarettes 20 1965 in 2017.  Smoked 10 to 20 cigarettes/day and then quit.  No cigar use.  No  passive smoking.  Some over 20 years ago she took 1 or 2 puffs of marijuana year very rarely but then stopped.  No cocaine use.  No intravenous drug use  HOME  EXPOSURE and HOBBY DETAILS :  [place] the last 3 months.  Age of the current home is built in 55.  She used to live with her ex-husband.  Then apartment and now townhouse since July 2022.  In one of her old house that she had as most problem but then she moved out.  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  She likes to pot plants.  OCCUPATIONAL HISTORY (122 questions) :  -Worked as a Education officer, museum in [place] and retired in 2016.  She  worked a bit in 2003 in 2016.  In her school office room there was no mold but in other rooms there was mold.  In 2002 she worked in [employer] in [place] and that had mold.  Her shortness of breath started after she stopped working in 2016.   - She does gardening for a hobby - Otherwise detail organic and inorganic antigen questions are negative  PULMONARY TOXICITY HISTORY (27 items):  - macrodantin - 1980s  INVESTIGATIONS: below      Simple office walk 185 feet x  3 laps goal with forehead probe 12/18/2020    O2 used ra   Number laps completed 3   Comments about pace mod   Resting Pulse Ox/HR 99% and 91/min   Final Pulse Ox/HR 94% and 131/min   Desaturated </= 88% no   Desaturated <= 3% points yes   Got Tachycardic >/= 90/min yes   Symptoms at end of test Moderte dyspnea   Miscellaneous comments x        Results for Stacy Tucker (MRN [id number]) as of 12/18/2020 14:57  Ref. Range 07/26/2019 10:54  Creatinine Latest Ref Range: 0.44 - 1.00 mg/dL 0.75   Results for Stacy Tucker (MRN [id number]) as of 12/18/2020 14:57  Ref. Range 07/26/2019 10:54  Hemoglobin Latest Ref Range: 12.0 - 15.0 g/dL 13.5    CT Chest data - Low Dose CT 11/24/20   CLINICAL DATA:  72 year old female former smoker (quit 5 years ago) with 38 pack-year history of smoking. Lung cancer screening examination.   EXAM: CT CHEST WITHOUT CONTRAST LOW-DOSE FOR LUNG CANCER SCREENING   TECHNIQUE: Multidetector CT imaging of the chest was performed following the standard protocol without IV contrast.   COMPARISON:  Low-dose lung cancer screening chest CT 11/06/2018.   FINDINGS: Cardiovascular: Heart size is normal. There is no significant pericardial fluid, thickening or pericardial calcification. Aortic atherosclerosis. No definite coronary artery calcifications.   Mediastinum/Nodes: No pathologically enlarged mediastinal or hilar lymph nodes. Please note that accurate  exclusion of hilar adenopathy is limited on noncontrast CT scans. Esophagus is unremarkable in appearance. No axillary lymphadenopathy.   Lungs/Pleura: No definite suspicious appearing pulmonary nodules or masses are noted. There is increasing patchy peripheral predominant ground-glass attenuation, septal thickening and some subpleural reticulation most evident in the mid to lower lungs bilaterally, concerning for progressive interstitial lung disease. No traction bronchiectasis or frank honeycombing. There is also mild diffuse bronchial wall thickening with very mild centrilobular and paraseptal emphysema. No acute consolidative airspace disease. No pleural effusions.   Upper Abdomen: Severe diffuse low attenuation throughout the visualized hepatic parenchyma, indicative of severe hepatic steatosis. Aortic atherosclerosis.   Musculoskeletal: Lucent lesion with  vertical trabeculations in the left side of the L2 vertebral body, most compatible with a cavernous hemangioma. There are no aggressive appearing lytic or blastic lesions noted in the visualized portions of the skeleton.   IMPRESSION: 1. Lung-RADS 1S, negative. Continue annual screening with low-dose chest CT without contrast in 12 months. 2. The "S" modifier above refers to potentially clinically significant non lung cancer related findings. Specifically, there are findings suggestive of progressive interstitial lung disease, as above, categorized as indeterminate for usual interstitial pneumonia (UIP) per current ATS guidelines. Outpatient referral to Pulmonology for further clinical evaluation is recommended. 3. Aortic atherosclerosis. 4. Mild diffuse bronchial wall thickening with very mild centrilobular and paraseptal emphysema. 5. Severe hepatic steatosis.   Aortic Atherosclerosis (ICD10-I70.0) and Emphysema (ICD10-J43.9).     Electronically Signed   By: Vinnie Langton M.D.   On: 11/25/2020 09:58      OV  02/26/2021  Subjective:  Patient ID: Stacy Tucker, female , DOB: Feb 09, 1951 , age 32 y.o. , MRN: [id number] , ADDRESS: [place] [address] PCP Stacy Hatchet, MD Patient Care Team: Stacy Hatchet, MD as PCP - General (Internal Medicine) Stacy Klein, MD as Consulting Physician (Cardiology)  This Provider for this visit: Treatment Team:  Attending Provider: Brand Males, MD    02/26/2021 -   Chief Complaint  Patient presents with   Follow-up    PFT performed 12/23. Pt states she has been doing okay since last visit and denies any complaints.    ILD work-up in progress HPI Stacy Tucker 72 y.o. -presents for follow-up.  She had pulmonary function test as part of evaluation for ILD and this is normal.  She had high-resolution CT chest read by Dr. Lorin Picket.  I personally visualized the film.  Dr. Rosario Jacks does not feel features stating with interstitial lung disease.  I have personally looked at the film and thought there might be some scarring at the lung base which Dr. Rosario Jacks acknowledges.  I believe based on Dr. Stark Tucker interpretation there is either early ILA or nonspecific scarring.  Nevertheless normal lung function would be consistent with absence of interstitial lung disease.  I have communicated this with the patient.  There is air trapping.  She does have dyspnea on exertion.  She understands her dyspnea could be because of obesity and also physical deconditioning.  She is willing to try empiric inhaler for the air trapping.  She was in tears of joy upon hearing that she does not have overt ILD.   Symptoms:   CT Chest data 0 HRCT 01/19/21  Narrative & Impression  CLINICAL DATA:  Interstitial lung disease   EXAM: CT CHEST WITHOUT CONTRAST   TECHNIQUE: Multidetector CT imaging of the chest was performed following the standard protocol without intravenous contrast. High resolution imaging of the lungs, as well as inspiratory and  expiratory imaging, was performed.   COMPARISON:  November 24, 2020 chest CT   FINDINGS: Cardiovascular: Normal heart size. No pericardial effusion. No significant coronary artery calcifications. Mild atherosclerotic disease of the thoracic aorta.   Mediastinum/Nodes: Small hiatal hernia. Thyroid is unremarkable. No enlarged lymph nodes seen in the chest.   Lungs/Pleura: Central airways are patent. Moderate bilateral air trapping. No consolidation, pleural effusion pneumothorax. Linear opacities of the lingula and left lower lobe which persist with prone imaging, likely due to scarring. No evidence of traction bronchiectasis or honeycomb change.   Upper Abdomen: Hepatic steatosis. A stone is seen in the gallbladder neck.  No acute abnormality.   Musculoskeletal: Multilevel degenerative disc disease. No aggressive appearing osseous lesions.   IMPRESSION: 1. Moderate bilateral air trapping, findings can be seen in the setting of small airways disease. 2. No evidence of fibrotic interstitial lung disease 3. A stone is seen in the gallbladder neck. 4. Aortic Atherosclerosis (ICD10-I70.0).     Electronically Signed   By: Yetta Glassman M.D.   On: 01/19/2021 09:42      No results found.    Simple office walk 185 feet x  3 laps goal with forehead probe 12/18/2020  02/26/2021 Changed lexapro to wellbutrn  O2 used ra   Number laps completed 3   Comments about pace mod   Resting Pulse Ox/HR 99% and 91/min   Final Pulse Ox/HR 94% and 131/min   Desaturated </= 88% no   Desaturated <= 3% points yes   Got Tachycardic >/= 90/min yes   Symptoms at end of test Moderte dyspnea   Miscellaneous comments x     PFT  PFT Results Latest Ref Rng & Units 02/13/2021  FVC-Pre L 2.45  FVC-Predicted Pre % 82  FVC-Post L 2.55  FVC-Predicted Post % 86  Pre FEV1/FVC % % 77  Post FEV1/FCV % % 79  FEV1-Pre L 1.89  FEV1-Predicted Pre % 84  FEV1-Post L 2.02  DLCO uncorrected ml/min/mmHg  16.55  DLCO UNC% % 85  DLCO corrected ml/min/mmHg 16.55  DLCO COR %Predicted % 85  DLVA Predicted % 93  TLC L 5.29  TLC % Predicted % 104  RV % Predicted % 105     Latest Reference Range & Units 12/18/20 15:33  Micropolyspora faeni, IgG Negative  Negative    Latest Reference Range & Units 12/18/20 15:33 12/18/20 15:34  Anti Nuclear Antibody (ANA) NEGATIVE  NEGATIVE   Cyclic Citrullin Peptide Ab UNITS <16   ds DNA Ab IU/mL 1   RA Latex Turbid. <14 IU/mL <14   SSA (Ro) (ENA) Antibody, IgG <1.0 NEG AI <1.0 NEG   SSB (La) (ENA) Antibody, IgG <1.0 NEG AI <1.0 NEG   Scleroderma (Scl-70) (ENA) Antibody, IgG <1.0 NEG AI <1.0 NEG   QUANTIFERON-TB GOLD PLUS   Rpt  A.Fumigatus #1 Abs Negative  Negative   Micropolyspora faeni, IgG Negative  Negative   Thermoactinomyces vulgaris, IgG Negative  Negative   A. Pullulans Abs Negative  Negative   Thermoact. Saccharii Negative  Negative   [place]on Serum Abs Negative  Negative   Rpt: View report in Results Review for more information    OV 03/15/2022  Subjective:  Patient ID: Stacy Tucker, female , DOB: March 22, 1950 , age 31 y.o. , MRN: [id number] , ADDRESS: [address] [place] [facility] [address] PCP Stacy Hatchet, MD Patient Care Team: Stacy Hatchet, MD as PCP - General (Internal Medicine) Stacy Klein, MD as Consulting Physician (Cardiology)  This Provider for this visit: Treatment Team:  Attending Provider: Brand Males, MD    03/15/2022 -   Chief Complaint  Patient presents with   Follow-up    Pt is here for follow up for asthma. Pt is currently on Symbicort daily and Albuterol as needed. Pt reports not having to use the Albuterol at all since last year. Pt states she is doing well with no issues., ACT score was 21.    #Follow-up pulmonary air-trapping clinically being treated as mild persistent asthma/moderate persistent asthma #History of nasal allergies  HPI Stacy Tucker 72 y.o. -returns for 1 year  follow-up.  In the  last 1 year she has been on Symbicort started empirically based on air-trapping and shortness of breath.  She says it is done wonders for her.  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  In October 2023 went to [place].  She walked 22 miles in 7 days and did not have any shortness of breath.  She attributes all this to Symbicort.  Her quality of life is tremendously improved.  She also is now reporting history of chronic on and off nasal allergies.  She states she has taken nasal antihistamine spray presumably but this has not helped and is irritating her nose.  We discussed about using saline spray as an alternative and then if this does not help moving on to taking over-the-counter antihistaminic tablets.  Review the records indicate that she has never had a RAST allergy panel.  But at this point in time she is feeling better.    SYMPTOM SCALE - 12/18/2020 02/26/2021   Current weight    O2 use ra ra  Shortness of Breath 0 -> 5 scale with 5 being worst (score 6 If unable to do)   At rest 0.25 0  Simple tasks - showers, clothes change, eating, shaving 0.25 1  Household (dishes, doing bed, laundry) 0.25 1  Shopping 0.25 1  Walking level at own pace [place] for a while - 2 blocks 2  Walking up Stairs sometimes 2  Total (30-36) Dyspnea Score x 7  How bad is your cough? 0 0  How bad is your fatigue 2 1  How bad is nausea 0 0  How bad is vomiting?  0 0  How bad is diarrhea? 2 0  How bad is anxiety? ok 1  How bad is depression Takes lexapro and fine 1  Any chronic pain - if so where and how bad Chronic arthritis pain     PFT     Latest Ref Rng & Units 02/13/2021   11:09 AM  PFT Results  FVC-Pre L 2.45   FVC-Predicted Pre % 82   FVC-Post L 2.55   FVC-Predicted Post % 86   Pre FEV1/FVC % % 77   Post FEV1/FCV % % 79   FEV1-Pre L 1.89   FEV1-Predicted Pre % 84   FEV1-Post L 2.02   DLCO  uncorrected ml/min/mmHg 16.55   DLCO UNC% % 85   DLCO corrected ml/min/mmHg 16.55   DLCO COR %Predicted % 85   DLVA Predicted % 93   TLC L 5.29   TLC % Predicted % 104   RV % Predicted % 105        has a past medical history of Allergy, Anal skin tags (10/18/2016), Arthritis, Asthma, Cancer ([place]), Chicken pox, Depression, Endometriosis, External hemorrhoid, H/O bladder infections, adenomatous polyp of colon (04/24/2014), Hypertension, Hypothyroidism, ILD (interstitial lung disease) ([place]), Internal hemorrhoid, Pneumonia, Prolapsed internal hemorrhoids, grade 2 (10/18/2016), and Vaginal delivery.   reports that she quit smoking about 7 years ago. Her smoking use included cigarettes. She has a 45.00 pack-year smoking history. She has never used smokeless tobacco.  Past Surgical History:  Procedure Laterality Date   CARDIAC CATHETERIZATION     all normal    COLONOSCOPY W/ POLYPECTOMY     HEMORRHOID BANDING     TOTAL KNEE ARTHROPLASTY Left 2012   wisdom  teeth     no sedation with this, just novacaine  Allergies  Allergen Reactions   Doxycycline Swelling   Augmentin [Amoxicillin-Pot Clavulanate] Nausea And Vomiting   Hydrocodone Other (See Comments)    Eyes would cross with this    Influenza Vaccines Other (See Comments)    Fever, severe flu symptoms   Lorazepam Hives   Macrodantin [Nitrofurantoin Macrocrystal] Hives   Penicillins Other (See Comments)    As child    Pristiq [Desvenlafaxine] Other (See Comments)    Fever and legs stiff, couldn't ambulate    Sulfa Antibiotics Rash    Immunization History  Administered Date(s) Administered   Janssen (J&J) SARS-COV-2 Vaccination 03/27/2020   Tdap 04/22/2013   Unspecified SARS-COV-2 Vaccination 03/27/2020   Zoster, Live 04/04/2015, 08/04/2015    Family History  Problem Relation Age of Onset   Colon cancer Paternal Grandmother 98       no intervention   Arthritis Paternal Grandmother    Dementia Paternal Grandmother     Arthritis Mother    Heart disease Mother    Hypertension Mother    Depression Mother    Dementia Mother    Heart attack Mother    Other Mother        C-Diff, and also had flesh eating bateria (strep type) on her right calf   Arthritis Father    Alcohol abuse Paternal Grandfather    Hypertension Brother    Depression Brother    Arthritis Daughter    Breast cancer Maternal Aunt    Hypertension Brother    Depression Brother    Breast cancer Cousin    Esophageal cancer Neg Hx    Rectal cancer Neg Hx    Stomach cancer Neg Hx      Current Outpatient Medications:    betamethasone valerate (VALISONE) 0.1 % cream, Apply 1 application topically daily as needed (for skin irritation). , Disp: , Rfl:    buPROPion (WELLBUTRIN XL) 150 MG 24 hr tablet, Take 150 mg by mouth daily., Disp: , Rfl:    Chlorphen-PE-Acetaminophen (CORICIDIN D COLD/FLU/SINUS PO), Take 1 tablet by mouth daily as needed (for allergy)., Disp: , Rfl:    cholecalciferol (VITAMIN D) 1000 units tablet, Take 2,000 Units by mouth daily., Disp: , Rfl:    estradiol (ESTRACE) 0.1 MG/GM vaginal cream, Place 1 Applicatorful vaginally once a week., Disp: , Rfl:    hydrocortisone (ANUSOL-HC) 2.5 % rectal cream, APPLY RECTALLY TWICE A DAY, Disp: 30 g, Rfl: 0   hydrocortisone (ANUSOL-HC) 25 MG suppository, Place 25 mg rectally 2 (two) times daily., Disp: , Rfl:    ibuprofen (ADVIL) 100 MG tablet, Take 200 mg by mouth every 6 (six) hours as needed for pain or fever., Disp: , Rfl:    levothyroxine (SYNTHROID, LEVOTHROID) 75 MCG tablet, Take 75 mcg by mouth daily before breakfast., Disp: , Rfl:    meloxicam (MOBIC) 15 MG tablet, Take 15 mg by mouth daily., Disp: , Rfl:    olmesartan (BENICAR) 40 MG tablet, olmesartan 40 mg tablet  TAKE 1 TABLET BY MOUTH EVERY DAY FOR 90 DAYS, Disp: , Rfl:    Omega-3 Fatty Acids (FISH OIL) 1000 MG CAPS, Take 2,000 mg by mouth daily., Disp: , Rfl:    albuterol (VENTOLIN HFA) 108 (90 Base) MCG/ACT inhaler,  INHALE 1 TO 2 PUFFS BY MOUTH EVERY 4 HOURS AS NEEDED for 30, Disp: 18 g, Rfl: 4   budesonide-formoterol (SYMBICORT) 80-4.5 MCG/ACT inhaler, Inhale 2 puffs into the lungs in the morning and at bedtime., Disp: 30.6 g, Rfl: 11  Objective:   Vitals:   03/15/22 0901  BP: 124/70  Pulse: 73  SpO2: 97%  Weight: 213 lb 3.2 oz (96.7 kg)  Height: '5\' 4"'$  (1.626 m)    Estimated body mass index is 36.6 kg/m as calculated from the following:   Height as of this encounter: '5\' 4"'$  (1.626 m).   Weight as of this encounter: 213 lb 3.2 oz (96.7 kg).  '@WEIGHTCHANGE'$ @  [employer]   03/15/22 0901  Weight: 213 lb 3.2 oz (96.7 kg)     Physical ExamGeneral: No distress. Looks well Neuro: Alert and Oriented x 3. GCS 15. Speech normal Psych: Pleasant Resp:  Barrel Chest - no.  Wheeze - no, Crackles - no, No overt respiratory distress CVS: Normal heart sounds. Murmurs - no Ext: Stigmata of Connective Tissue Disease - no HEENT: Normal upper airway. PEERL +. No post nasal drip        Assessment:       ICD-10-CM   1. Pulmonary air trapping  R09.89     2. DOE (dyspnea on exertion)  R06.09     3. Mild persistent asthma without complication  G40.10 budesonide-formoterol (SYMBICORT) 80-4.5 MCG/ACT inhaler    albuterol (VENTOLIN HFA) 108 (90 Base) MCG/ACT inhaler    4. Allergic rhinitis, unspecified seasonality, unspecified trigger  J30.9          Plan:     Patient Instructions     ICD-10-CM   1. Pulmonary air trapping  R09.89     2. DOE (dyspnea on exertion)  R06.09     3. Mild persistent asthma without complication  U72.53     4. Allergic rhinitis, unspecified seasonality, unspecified trigger  J30.9      Pulmonary air trapping DOE (dyspnea on exertion) Mild persistent asthma without complication   - clinical behavior is one of low grade persistent asthma; glad you are better with symbicort  plan -Continue Symbicort 80/4.5 at 2 puffs twice daily for another year and then  we can reassess about tapering it down  -CMA to send refill -Use albuterol as needed   Allergic rhinitis, unspecified seasonality, unspecified trigger  Plan  - I would recommend starting with saline nasal spray and if this does not work you can use an antihistamine pill [over-the-counter allergy tablet] -If symptoms are out of control then we can always check blood for RAST allergy test in the future as needed   Followup  - return in 1  year or sooner if needed   Moderate Complexity MDM NEW OFFICE  The table below is from the 2021 E/M guidelines, first released in 2021, with minor revisions added in 2023. Must meet the requirements for 2 out of 3 dimensions to qualify.    Number and complexity of problems addressed Amount and/or complexity of data reviewed Risk of complications and/or morbidity  One or more chronic illness with mild exacerbation, progression, or side effects of treatment  Two or more stable chronic illnesses  One undiagnosed new problem with uncertain prognosis  One acute illness with systemic symptoms   Acute complicated injury Must meet the requirements for 1 of 3 of the categories)  Category 1: Tests and documents, historian  Any combination of 3 of the following:  Assessment requiring an independent historian  Review of prior external records  Review of results of each unique test  Ordering of each unique test    Category 2: Interpretation of tests  Independent interpretation of a test perfromed by another physician/NPP  Category  3: Discuss management/tests  Discussion of magagement or tests with an external physician/NPP Prescription drug management  Decision regarding minor surgery with identfied patient or procedure risk factors  Decision regarding elective major surgery without identified patient or procedure risk factors  Diagnosis or treatment significantly limited by social determinants of health            SIGNATURE     Dr. Brand Tucker, M.D., F.C.C.P,  Pulmonary and Critical Care Medicine Staff Physician, [place] Director - Interstitial Lung Disease  Program  Pulmonary [place] [facility] [place], [place], [address]  Pager: [phone], If no answer or between  15:00h - 7:00h: call [phone] Telephone: [phone]  9:25 AM 03/15/2022

## 2022-03-22 DIAGNOSIS — Z6837 Body mass index (BMI) 37.0-37.9, adult: Secondary | ICD-10-CM | POA: Diagnosis not present

## 2022-03-22 DIAGNOSIS — Z124 Encounter for screening for malignant neoplasm of cervix: Secondary | ICD-10-CM | POA: Diagnosis not present

## 2022-03-22 DIAGNOSIS — Z1231 Encounter for screening mammogram for malignant neoplasm of breast: Secondary | ICD-10-CM | POA: Diagnosis not present

## 2022-04-07 DIAGNOSIS — R29890 Loss of height: Secondary | ICD-10-CM | POA: Diagnosis not present

## 2022-04-07 DIAGNOSIS — M8588 Other specified disorders of bone density and structure, other site: Secondary | ICD-10-CM | POA: Diagnosis not present

## 2022-04-07 DIAGNOSIS — N958 Other specified menopausal and perimenopausal disorders: Secondary | ICD-10-CM | POA: Diagnosis not present

## 2022-04-07 DIAGNOSIS — J45909 Unspecified asthma, uncomplicated: Secondary | ICD-10-CM | POA: Diagnosis not present

## 2022-05-07 DIAGNOSIS — E039 Hypothyroidism, unspecified: Secondary | ICD-10-CM | POA: Diagnosis not present

## 2022-05-07 DIAGNOSIS — E1169 Type 2 diabetes mellitus with other specified complication: Secondary | ICD-10-CM | POA: Diagnosis not present

## 2022-05-07 DIAGNOSIS — E559 Vitamin D deficiency, unspecified: Secondary | ICD-10-CM | POA: Diagnosis not present

## 2022-05-07 DIAGNOSIS — I700 Atherosclerosis of aorta: Secondary | ICD-10-CM | POA: Diagnosis not present

## 2022-05-07 DIAGNOSIS — F419 Anxiety disorder, unspecified: Secondary | ICD-10-CM | POA: Diagnosis not present

## 2022-05-07 DIAGNOSIS — F330 Major depressive disorder, recurrent, mild: Secondary | ICD-10-CM | POA: Diagnosis not present

## 2022-05-07 DIAGNOSIS — I10 Essential (primary) hypertension: Secondary | ICD-10-CM | POA: Diagnosis not present

## 2022-06-18 DIAGNOSIS — B351 Tinea unguium: Secondary | ICD-10-CM | POA: Diagnosis not present

## 2022-06-18 DIAGNOSIS — M79675 Pain in left toe(s): Secondary | ICD-10-CM | POA: Diagnosis not present

## 2022-07-28 DIAGNOSIS — D225 Melanocytic nevi of trunk: Secondary | ICD-10-CM | POA: Diagnosis not present

## 2022-07-28 DIAGNOSIS — L570 Actinic keratosis: Secondary | ICD-10-CM | POA: Diagnosis not present

## 2022-07-28 DIAGNOSIS — Z1283 Encounter for screening for malignant neoplasm of skin: Secondary | ICD-10-CM | POA: Diagnosis not present

## 2022-07-28 DIAGNOSIS — L820 Inflamed seborrheic keratosis: Secondary | ICD-10-CM | POA: Diagnosis not present

## 2022-07-28 DIAGNOSIS — L603 Nail dystrophy: Secondary | ICD-10-CM | POA: Diagnosis not present

## 2022-07-28 DIAGNOSIS — X32XXXD Exposure to sunlight, subsequent encounter: Secondary | ICD-10-CM | POA: Diagnosis not present

## 2022-08-30 IMAGING — MR MR [PERSON_NAME]
6 series · 16 of 16 positions shown · non-contrast
Comparison: None.

CLINICAL DATA: Left-sided temporomandibular joint pain

EXAM:
MRI OF TEMPOROMANDIBULAR JOINT WITHOUT CONTRAST
TECHNIQUE: Multiplanar, multisequence MR imaging of the temporomandibular joint
was performed following the standard protocol. No intravenous
contrast was administered. Open mouth sequences were obtained at 4
mm and 8 mm. Patient unable to tolerate further imaging.

[Series 5: t1_ax closed mouth · axial · 4.0mm · 0.80mm/px · 1 of 15 slices shown]
[im 1/15]
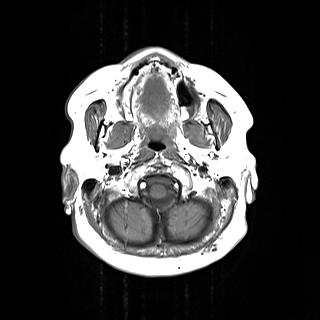

[Series 8: t2_tse_fs_sag closed · sagittal · 4.0mm · 0.62mm/px · 3 of 18 slices shown]
[im 1/18]
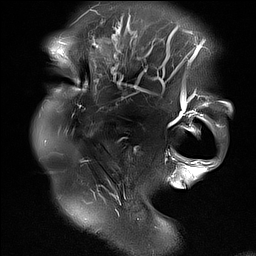
[im 9/18]
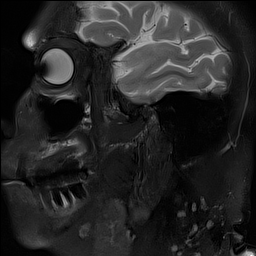
[im 18/18]
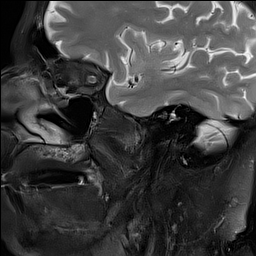

[Series 9: pd_tse_sag closed · sagittal · 4.0mm · 0.44mm/px · 3 of 18 slices shown]
[im 1/18]
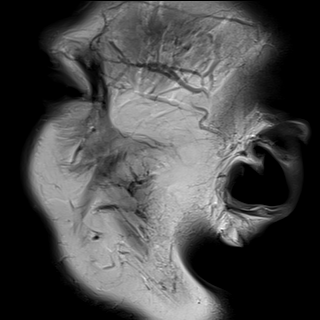
[im 9/18]
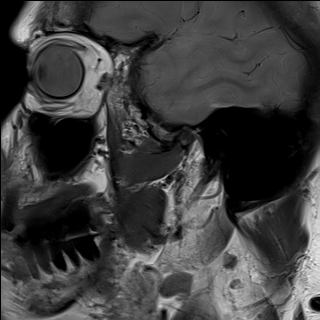
[im 18/18]
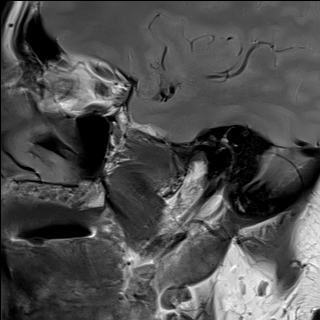

[Series 10: pd_tse_cor closed · coronal · 4.0mm · 0.44mm/px · 3 of 18 slices shown]
[im 1/18]
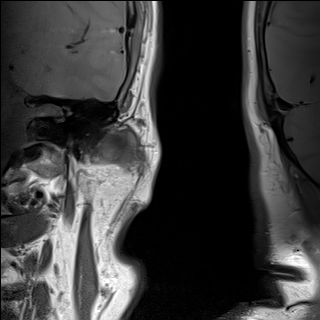
[im 9/18]
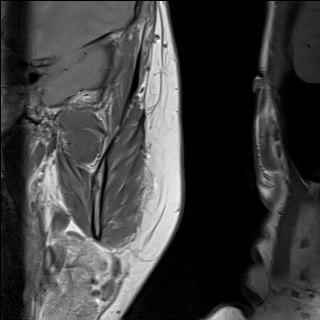
[im 18/18]
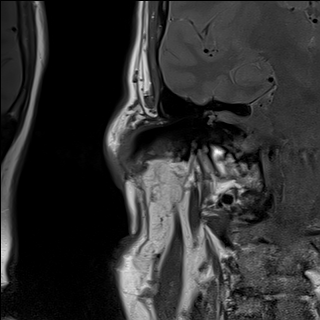

[Series 11: pd_tse_sag open 4 · sagittal · 2.0mm · 0.44mm/px · 3 of 22 slices shown]
[im 1/22]
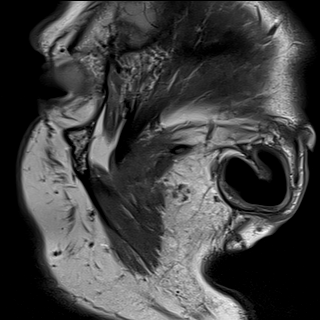
[im 11/22]
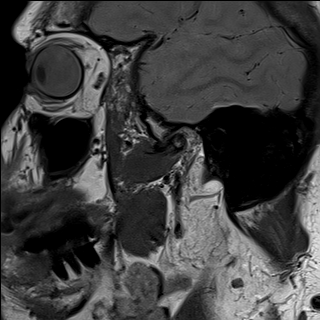
[im 22/22]
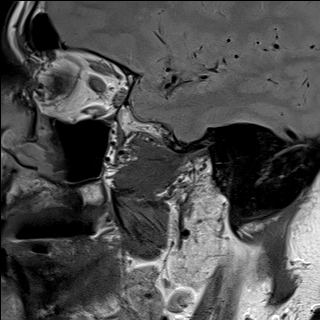

[Series 12: pd_tse_sag open 8 · sagittal · 2.0mm · 0.44mm/px · 3 of 22 slices shown]
[im 1/22]
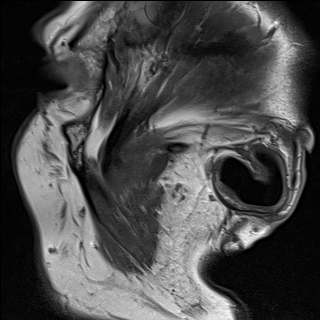
[im 11/22]
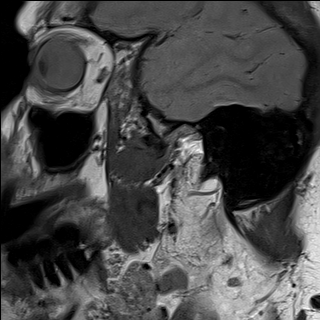
[im 22/22]
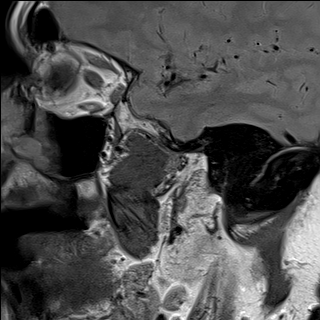

[16 of 16 positions shown; findings below may reference images not displayed]

FINDINGS: Right temporomandibular joint: The articular disc is anteriorly
dislocated relative to the mandibular condyle in both open and
closed mouth positions.There is normal anterior translation of the
mandibular condyle with jaw opening at 4 mm and 8 mm. No disc
recapture. Moderate degenerative joint disease with flattening of
the mandibular condyle. No joint effusion. No erosion.

Left temporomandibular joint: The articular disc is anteriorly
dislocated relative to the mandibular condyle in both open and
closed mouth positions.There is limited anterior translation of the
mandibular condyle with jaw opening at 4 mm and 8 mm. No disc
recapture. Moderate to severe degenerative joint disease with
flattening of the mandibular condyle and mild subchondral marrow
edema. No joint effusion. No erosion.

Other: None.
IMPRESSION: Bilateral TMJ dysfunction with osteoarthritis of both joints, left
worse than right.

## 2022-11-15 DIAGNOSIS — M1711 Unilateral primary osteoarthritis, right knee: Secondary | ICD-10-CM | POA: Diagnosis not present

## 2022-11-29 DIAGNOSIS — I10 Essential (primary) hypertension: Secondary | ICD-10-CM | POA: Diagnosis not present

## 2022-11-29 DIAGNOSIS — E039 Hypothyroidism, unspecified: Secondary | ICD-10-CM | POA: Diagnosis not present

## 2022-11-29 DIAGNOSIS — E1169 Type 2 diabetes mellitus with other specified complication: Secondary | ICD-10-CM | POA: Diagnosis not present

## 2022-11-29 DIAGNOSIS — R7301 Impaired fasting glucose: Secondary | ICD-10-CM | POA: Diagnosis not present

## 2022-11-29 DIAGNOSIS — E559 Vitamin D deficiency, unspecified: Secondary | ICD-10-CM | POA: Diagnosis not present

## 2022-12-06 DIAGNOSIS — E559 Vitamin D deficiency, unspecified: Secondary | ICD-10-CM | POA: Diagnosis not present

## 2022-12-06 DIAGNOSIS — E1169 Type 2 diabetes mellitus with other specified complication: Secondary | ICD-10-CM | POA: Diagnosis not present

## 2022-12-06 DIAGNOSIS — I700 Atherosclerosis of aorta: Secondary | ICD-10-CM | POA: Diagnosis not present

## 2022-12-06 DIAGNOSIS — M1711 Unilateral primary osteoarthritis, right knee: Secondary | ICD-10-CM | POA: Diagnosis not present

## 2022-12-06 DIAGNOSIS — Z1339 Encounter for screening examination for other mental health and behavioral disorders: Secondary | ICD-10-CM | POA: Diagnosis not present

## 2022-12-06 DIAGNOSIS — I10 Essential (primary) hypertension: Secondary | ICD-10-CM | POA: Diagnosis not present

## 2022-12-06 DIAGNOSIS — Z0000 Encounter for general adult medical examination without abnormal findings: Secondary | ICD-10-CM | POA: Diagnosis not present

## 2022-12-06 DIAGNOSIS — F419 Anxiety disorder, unspecified: Secondary | ICD-10-CM | POA: Diagnosis not present

## 2022-12-06 DIAGNOSIS — F330 Major depressive disorder, recurrent, mild: Secondary | ICD-10-CM | POA: Diagnosis not present

## 2022-12-06 DIAGNOSIS — Z1331 Encounter for screening for depression: Secondary | ICD-10-CM | POA: Diagnosis not present

## 2022-12-13 DIAGNOSIS — M1711 Unilateral primary osteoarthritis, right knee: Secondary | ICD-10-CM | POA: Diagnosis not present

## 2022-12-27 DIAGNOSIS — M1711 Unilateral primary osteoarthritis, right knee: Secondary | ICD-10-CM | POA: Diagnosis not present

## 2023-01-17 DIAGNOSIS — R0981 Nasal congestion: Secondary | ICD-10-CM | POA: Diagnosis not present

## 2023-01-17 DIAGNOSIS — J45909 Unspecified asthma, uncomplicated: Secondary | ICD-10-CM | POA: Diagnosis not present

## 2023-01-17 DIAGNOSIS — R058 Other specified cough: Secondary | ICD-10-CM | POA: Diagnosis not present

## 2023-01-17 DIAGNOSIS — J209 Acute bronchitis, unspecified: Secondary | ICD-10-CM | POA: Diagnosis not present

## 2023-01-17 DIAGNOSIS — R5383 Other fatigue: Secondary | ICD-10-CM | POA: Diagnosis not present

## 2023-01-17 DIAGNOSIS — E1169 Type 2 diabetes mellitus with other specified complication: Secondary | ICD-10-CM | POA: Diagnosis not present

## 2023-01-17 DIAGNOSIS — Z1152 Encounter for screening for COVID-19: Secondary | ICD-10-CM | POA: Diagnosis not present

## 2023-01-17 DIAGNOSIS — J302 Other seasonal allergic rhinitis: Secondary | ICD-10-CM | POA: Diagnosis not present

## 2023-01-31 DIAGNOSIS — M25561 Pain in right knee: Secondary | ICD-10-CM | POA: Diagnosis not present

## 2023-01-31 DIAGNOSIS — R2689 Other abnormalities of gait and mobility: Secondary | ICD-10-CM | POA: Diagnosis not present

## 2023-02-09 DIAGNOSIS — R2689 Other abnormalities of gait and mobility: Secondary | ICD-10-CM | POA: Diagnosis not present

## 2023-02-09 DIAGNOSIS — M25561 Pain in right knee: Secondary | ICD-10-CM | POA: Diagnosis not present

## 2023-02-18 DIAGNOSIS — R2689 Other abnormalities of gait and mobility: Secondary | ICD-10-CM | POA: Diagnosis not present

## 2023-02-18 DIAGNOSIS — M25561 Pain in right knee: Secondary | ICD-10-CM | POA: Diagnosis not present

## 2023-02-22 DIAGNOSIS — M25561 Pain in right knee: Secondary | ICD-10-CM | POA: Diagnosis not present

## 2023-02-22 DIAGNOSIS — R2689 Other abnormalities of gait and mobility: Secondary | ICD-10-CM | POA: Diagnosis not present

## 2023-03-02 DIAGNOSIS — R2689 Other abnormalities of gait and mobility: Secondary | ICD-10-CM | POA: Diagnosis not present

## 2023-03-02 DIAGNOSIS — M25561 Pain in right knee: Secondary | ICD-10-CM | POA: Diagnosis not present

## 2023-03-07 DIAGNOSIS — M25561 Pain in right knee: Secondary | ICD-10-CM | POA: Diagnosis not present

## 2023-03-07 DIAGNOSIS — R2689 Other abnormalities of gait and mobility: Secondary | ICD-10-CM | POA: Diagnosis not present

## 2023-03-18 DIAGNOSIS — M25561 Pain in right knee: Secondary | ICD-10-CM | POA: Diagnosis not present

## 2023-03-18 DIAGNOSIS — R2689 Other abnormalities of gait and mobility: Secondary | ICD-10-CM | POA: Diagnosis not present

## 2023-03-21 DIAGNOSIS — M25561 Pain in right knee: Secondary | ICD-10-CM | POA: Diagnosis not present

## 2023-03-21 DIAGNOSIS — R2689 Other abnormalities of gait and mobility: Secondary | ICD-10-CM | POA: Diagnosis not present

## 2023-03-23 DIAGNOSIS — R2689 Other abnormalities of gait and mobility: Secondary | ICD-10-CM | POA: Diagnosis not present

## 2023-03-23 DIAGNOSIS — M25561 Pain in right knee: Secondary | ICD-10-CM | POA: Diagnosis not present

## 2023-03-28 DIAGNOSIS — R2689 Other abnormalities of gait and mobility: Secondary | ICD-10-CM | POA: Diagnosis not present

## 2023-03-28 DIAGNOSIS — M25561 Pain in right knee: Secondary | ICD-10-CM | POA: Diagnosis not present

## 2023-03-29 DIAGNOSIS — M25561 Pain in right knee: Secondary | ICD-10-CM | POA: Diagnosis not present

## 2023-03-29 DIAGNOSIS — R2689 Other abnormalities of gait and mobility: Secondary | ICD-10-CM | POA: Diagnosis not present

## 2023-04-05 ENCOUNTER — Other Ambulatory Visit: Payer: Self-pay | Admitting: Internal Medicine

## 2023-04-05 DIAGNOSIS — M25561 Pain in right knee: Secondary | ICD-10-CM | POA: Diagnosis not present

## 2023-04-05 DIAGNOSIS — J4530 Mild persistent asthma, uncomplicated: Secondary | ICD-10-CM

## 2023-04-05 DIAGNOSIS — R2689 Other abnormalities of gait and mobility: Secondary | ICD-10-CM | POA: Diagnosis not present

## 2023-04-11 DIAGNOSIS — R2689 Other abnormalities of gait and mobility: Secondary | ICD-10-CM | POA: Diagnosis not present

## 2023-04-11 DIAGNOSIS — M25561 Pain in right knee: Secondary | ICD-10-CM | POA: Diagnosis not present

## 2023-04-12 DIAGNOSIS — R2689 Other abnormalities of gait and mobility: Secondary | ICD-10-CM | POA: Diagnosis not present

## 2023-04-12 DIAGNOSIS — M25561 Pain in right knee: Secondary | ICD-10-CM | POA: Diagnosis not present

## 2023-04-18 DIAGNOSIS — M25561 Pain in right knee: Secondary | ICD-10-CM | POA: Diagnosis not present

## 2023-04-18 DIAGNOSIS — R2689 Other abnormalities of gait and mobility: Secondary | ICD-10-CM | POA: Diagnosis not present

## 2023-04-20 DIAGNOSIS — M25561 Pain in right knee: Secondary | ICD-10-CM | POA: Diagnosis not present

## 2023-04-20 DIAGNOSIS — R2689 Other abnormalities of gait and mobility: Secondary | ICD-10-CM | POA: Diagnosis not present

## 2023-04-25 DIAGNOSIS — R2689 Other abnormalities of gait and mobility: Secondary | ICD-10-CM | POA: Diagnosis not present

## 2023-04-25 DIAGNOSIS — M25561 Pain in right knee: Secondary | ICD-10-CM | POA: Diagnosis not present

## 2023-05-02 DIAGNOSIS — M25561 Pain in right knee: Secondary | ICD-10-CM | POA: Diagnosis not present

## 2023-05-02 DIAGNOSIS — R2689 Other abnormalities of gait and mobility: Secondary | ICD-10-CM | POA: Diagnosis not present

## 2023-05-10 DIAGNOSIS — M25561 Pain in right knee: Secondary | ICD-10-CM | POA: Diagnosis not present

## 2023-05-10 DIAGNOSIS — R2689 Other abnormalities of gait and mobility: Secondary | ICD-10-CM | POA: Diagnosis not present

## 2023-05-11 DIAGNOSIS — M25561 Pain in right knee: Secondary | ICD-10-CM | POA: Diagnosis not present

## 2023-05-11 DIAGNOSIS — R2689 Other abnormalities of gait and mobility: Secondary | ICD-10-CM | POA: Diagnosis not present

## 2023-05-16 DIAGNOSIS — Z1231 Encounter for screening mammogram for malignant neoplasm of breast: Secondary | ICD-10-CM | POA: Diagnosis not present

## 2023-05-16 DIAGNOSIS — Z6837 Body mass index (BMI) 37.0-37.9, adult: Secondary | ICD-10-CM | POA: Diagnosis not present

## 2023-05-16 DIAGNOSIS — Z01419 Encounter for gynecological examination (general) (routine) without abnormal findings: Secondary | ICD-10-CM | POA: Diagnosis not present

## 2023-06-20 DIAGNOSIS — F419 Anxiety disorder, unspecified: Secondary | ICD-10-CM | POA: Diagnosis not present

## 2023-06-20 DIAGNOSIS — E1169 Type 2 diabetes mellitus with other specified complication: Secondary | ICD-10-CM | POA: Diagnosis not present

## 2023-06-20 DIAGNOSIS — J45909 Unspecified asthma, uncomplicated: Secondary | ICD-10-CM | POA: Diagnosis not present

## 2023-06-20 DIAGNOSIS — F330 Major depressive disorder, recurrent, mild: Secondary | ICD-10-CM | POA: Diagnosis not present

## 2023-06-20 DIAGNOSIS — E039 Hypothyroidism, unspecified: Secondary | ICD-10-CM | POA: Diagnosis not present

## 2023-06-20 DIAGNOSIS — I10 Essential (primary) hypertension: Secondary | ICD-10-CM | POA: Diagnosis not present

## 2023-06-20 DIAGNOSIS — I700 Atherosclerosis of aorta: Secondary | ICD-10-CM | POA: Diagnosis not present

## 2023-07-03 ENCOUNTER — Other Ambulatory Visit: Payer: Self-pay | Admitting: Internal Medicine

## 2023-07-03 DIAGNOSIS — J4530 Mild persistent asthma, uncomplicated: Secondary | ICD-10-CM

## 2023-07-14 DIAGNOSIS — N898 Other specified noninflammatory disorders of vagina: Secondary | ICD-10-CM | POA: Diagnosis not present

## 2023-07-14 DIAGNOSIS — R519 Headache, unspecified: Secondary | ICD-10-CM | POA: Diagnosis not present

## 2023-07-14 DIAGNOSIS — I10 Essential (primary) hypertension: Secondary | ICD-10-CM | POA: Diagnosis not present

## 2023-07-14 DIAGNOSIS — W57XXXA Bitten or stung by nonvenomous insect and other nonvenomous arthropods, initial encounter: Secondary | ICD-10-CM | POA: Diagnosis not present

## 2023-07-14 DIAGNOSIS — N390 Urinary tract infection, site not specified: Secondary | ICD-10-CM | POA: Diagnosis not present

## 2023-07-14 DIAGNOSIS — J302 Other seasonal allergic rhinitis: Secondary | ICD-10-CM | POA: Diagnosis not present

## 2023-07-14 DIAGNOSIS — S40862A Insect bite (nonvenomous) of left upper arm, initial encounter: Secondary | ICD-10-CM | POA: Diagnosis not present

## 2023-08-01 DIAGNOSIS — L821 Other seborrheic keratosis: Secondary | ICD-10-CM | POA: Diagnosis not present

## 2023-08-01 DIAGNOSIS — Z1283 Encounter for screening for malignant neoplasm of skin: Secondary | ICD-10-CM | POA: Diagnosis not present

## 2023-08-01 DIAGNOSIS — L570 Actinic keratosis: Secondary | ICD-10-CM | POA: Diagnosis not present

## 2023-08-01 DIAGNOSIS — D225 Melanocytic nevi of trunk: Secondary | ICD-10-CM | POA: Diagnosis not present

## 2023-08-01 DIAGNOSIS — X32XXXD Exposure to sunlight, subsequent encounter: Secondary | ICD-10-CM | POA: Diagnosis not present

## 2023-08-02 DIAGNOSIS — F3289 Other specified depressive episodes: Secondary | ICD-10-CM | POA: Diagnosis not present

## 2023-08-02 DIAGNOSIS — E66812 Obesity, class 2: Secondary | ICD-10-CM | POA: Diagnosis not present

## 2023-08-02 DIAGNOSIS — E559 Vitamin D deficiency, unspecified: Secondary | ICD-10-CM | POA: Diagnosis not present

## 2023-08-02 DIAGNOSIS — E039 Hypothyroidism, unspecified: Secondary | ICD-10-CM | POA: Diagnosis not present

## 2023-08-02 DIAGNOSIS — I10 Essential (primary) hypertension: Secondary | ICD-10-CM | POA: Diagnosis not present

## 2023-08-02 DIAGNOSIS — M1711 Unilateral primary osteoarthritis, right knee: Secondary | ICD-10-CM | POA: Diagnosis not present

## 2023-08-02 DIAGNOSIS — R7303 Prediabetes: Secondary | ICD-10-CM | POA: Diagnosis not present

## 2023-08-02 DIAGNOSIS — E8889 Other specified metabolic disorders: Secondary | ICD-10-CM | POA: Diagnosis not present

## 2023-08-16 DIAGNOSIS — M1711 Unilateral primary osteoarthritis, right knee: Secondary | ICD-10-CM | POA: Diagnosis not present

## 2023-08-16 DIAGNOSIS — I10 Essential (primary) hypertension: Secondary | ICD-10-CM | POA: Diagnosis not present

## 2023-08-16 DIAGNOSIS — R7303 Prediabetes: Secondary | ICD-10-CM | POA: Diagnosis not present

## 2023-08-16 DIAGNOSIS — F3289 Other specified depressive episodes: Secondary | ICD-10-CM | POA: Diagnosis not present

## 2023-08-16 DIAGNOSIS — Z6836 Body mass index (BMI) 36.0-36.9, adult: Secondary | ICD-10-CM | POA: Diagnosis not present

## 2023-08-16 DIAGNOSIS — E66812 Obesity, class 2: Secondary | ICD-10-CM | POA: Diagnosis not present

## 2023-08-29 NOTE — Progress Notes (Unsigned)
 OV 12/18/2020 - new consult Subjective:  Patient ID: Stacy Tucker, female , DOB: [birth year] , age 73 y.o. , MRN: [id number] , ADDRESS: [address] [place] [address] PCP Larnell Hamilton, MD Patient Care Team: Larnell Hamilton, MD as PCP - General (Internal Medicine) Francyne Headland, MD as Consulting Physician (Cardiology)  This Provider for this visit: Treatment Team:  Attending Provider: Geronimo Amel, MD    12/18/2020 -   Chief Complaint  Patient presents with   Consult    Pt states she is a former smoker and imaging showed ILD. Pt states that she does have complaints of SOB with exertion that is also happening when sleeping. Denies any complaints of cough, wheezing, or chest discomfort.     HPI Stacy Tucker 73 y.o. -presents with her only daughter [name].  She is a former Engineer, site.  This concern for interstitial lung disease.  She is naturally nervous about this.  She tells me that she used to smoke and she quit.  She had a low-dose CT scan of the chest 2 years ago and she was reassured [in my personal visualization she has early interstitial lung abnormalities even 2 years ago and I showed this to her].  That she had a repeat low-dose CT scan and this is now more pronounced [I personally visualized this and I do agree with that] and therefore she is being referred here.  She says that she did had COVID in the interim particularly around June 2022 with high fever.  This is an outpatient treatment.  Nevertheless since 2016 she feels she said insidious worsening of dyspnea in onset.  She is overweight.  She is attributed some of the dyspnea due to that.  Details of her interstitial lung disease questionnaire are listed below    [facility] Integrated Comprehensive ILD Questionnaire  Symptoms:      Past Medical History :  -She had allergy skin test around 2016. She says she is allergic to most plants and grasses this has been going on for  several decades - She has chronic arthritis.  Early morning she is actually better it gets worse through the course of the day.  Is been going on for 20 years.  No formal diagnosis of rheumatoid arthritis - She has had a left knee replacement.  She says she needs a right knee replacement - She does have hypertension - She is not diabetic but her hemoglobin A1c is slightly high according to history - She does take Synthroid - She will issue might of had pneumonia in the distant past - Did have COVID in June 2022 with 2 days of high fever no respiratory problems - she is history of tick bite in summer 2021 - - summer 2022 - spider bite in the last 1 year or so. - Measles , chicken pox, strep throat - 1997 asthma attack - wlkig pneumonia several times in pst years - 2016: had it for 3 months   ROS:  -Has arthralgia for 20 years.  Also has some fatigue. - Has some acid reflux symptoms on and off - Does do some snoring  FAMILY HISTORY of LUNG DISEASE:   -Her grandmother had deformed fingers as she aged.?  Rheumatoid arthritis but she is not sure - Her brother had childhood asthma - Otherwise negative  PERSONAL EXPOSURE HISTORY:   -He smokes cigarettes 20 1965 in 2017.  Smoked 10 to 20 cigarettes/day and then quit.  No cigar  use.  No passive smoking.  Some over 20 years ago she took 1 or 2 puffs of marijuana year very rarely but then stopped.  No cocaine use.  No intravenous drug use  HOME  EXPOSURE and HOBBY DETAILS :  [place] the last 3 months.  Age of the current home is built in 50.  She used to live with her ex-husband.  Then apartment and now townhouse since July 2022.  In one of her old house that she had as most problem but then she moved out.  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  She likes to pot plants.  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   She worked a bit in 2003 in 2016.  In her school office room there was no mold but in other rooms there was mold.  In 2002 she worked in [employer] in [place] and that had mold.  Her shortness of breath started after she stopped working in 2016.   - She does gardening for a hobby - Otherwise detail organic and inorganic antigen questions are negative  PULMONARY TOXICITY HISTORY (27 items):  - macrodantin - 1980s  INVESTIGATIONS: below      Simple office walk 185 feet x  3 laps goal with forehead probe 12/18/2020    O2 used ra   Number laps completed 3   Comments about pace mod   Resting Pulse Ox/HR 99% and 91/min   Final Pulse Ox/HR 94% and 131/min   Desaturated </= 88% no   Desaturated <= 3% points yes   Got Tachycardic >/= 90/min yes   Symptoms at end of test Moderte dyspnea   Miscellaneous comments x        Results for NORELL, BRISBIN (MRN [id number]) as of 12/18/2020 14:57  Ref. Range 07/26/2019 10:54  Creatinine Latest Ref Range: 0.44 - 1.00 mg/dL 9.24   Results for SACOYA, MCGOURTY (MRN [id number]) as of 12/18/2020 14:57  Ref. Range 07/26/2019 10:54  Hemoglobin Latest Ref Range: 12.0 - 15.0 g/dL 86.4    CT Chest data - Low Dose CT 11/24/20   CLINICAL DATA:  73 year old female former smoker (quit 5 years ago) with 38 pack-year history of smoking. Lung cancer screening examination.   EXAM: CT CHEST WITHOUT CONTRAST LOW-DOSE FOR LUNG CANCER SCREENING   TECHNIQUE: Multidetector CT imaging of the chest was performed following the standard protocol without IV contrast.   COMPARISON:  Low-dose lung cancer screening chest CT 11/06/2018.   FINDINGS: Cardiovascular: Heart size is normal. There is no significant pericardial fluid, thickening or pericardial calcification. Aortic atherosclerosis. No definite coronary artery calcifications.   Mediastinum/Nodes: No pathologically enlarged mediastinal or hilar lymph nodes. Please note that  accurate exclusion of hilar adenopathy is limited on noncontrast CT scans. Esophagus is unremarkable in appearance. No axillary lymphadenopathy.   Lungs/Pleura: No definite suspicious appearing pulmonary nodules or masses are noted. There is increasing patchy peripheral predominant ground-glass attenuation, septal thickening and some subpleural reticulation most evident in the mid to lower lungs bilaterally, concerning for progressive interstitial lung disease. No traction bronchiectasis or frank honeycombing. There is also mild diffuse bronchial wall thickening with very mild centrilobular and paraseptal emphysema. No acute consolidative airspace disease. No pleural effusions.   Upper Abdomen: Severe diffuse low attenuation throughout the visualized hepatic parenchyma, indicative of severe hepatic steatosis. Aortic atherosclerosis.   Musculoskeletal:  Lucent lesion with vertical trabeculations in the left side of the L2 vertebral body, most compatible with a cavernous hemangioma. There are no aggressive appearing lytic or blastic lesions noted in the visualized portions of the skeleton.   IMPRESSION: 1. Lung-RADS 1S, negative. Continue annual screening with low-dose chest CT without contrast in 12 months. 2. The S modifier above refers to potentially clinically significant non lung cancer related findings. Specifically, there are findings suggestive of progressive interstitial lung disease, as above, categorized as indeterminate for usual interstitial pneumonia (UIP) per current ATS guidelines. Outpatient referral to Pulmonology for further clinical evaluation is recommended. 3. Aortic atherosclerosis. 4. Mild diffuse bronchial wall thickening with very mild centrilobular and paraseptal emphysema. 5. Severe hepatic steatosis.   Aortic Atherosclerosis (ICD10-I70.0) and Emphysema (ICD10-J43.9).     Electronically Signed   By: Toribio Aye M.D.   On: 11/25/2020 09:58       OV 02/26/2021  Subjective:  Patient ID: Stacy Tucker, female , DOB: 05/12/1950 , age 29 y.o. , MRN: [id number] , ADDRESS: [address] [place] [address] PCP Larnell Hamilton, MD Patient Care Team: Larnell Hamilton, MD as PCP - General (Internal Medicine) Francyne Headland, MD as Consulting Physician (Cardiology)  This Provider for this visit: Treatment Team:  Attending Provider: Geronimo Amel, MD    02/26/2021 -   Chief Complaint  Patient presents with   Follow-up    PFT performed 12/23. Pt states she has been doing okay since last visit and denies any complaints.    ILD work-up in progress HPI Stacy Tucker 73 y.o. -presents for follow-up.  She had pulmonary function test as part of evaluation for ILD and this is normal.  She had high-resolution CT chest read by Dr. Newell Eke.  I personally visualized the film.  Dr. Eke does not feel features stating with interstitial lung disease.  I have personally looked at the film and thought there might be some scarring at the lung base which Dr. Eke acknowledges.  I believe based on Dr. Lita interpretation there is either early ILA or nonspecific scarring.  Nevertheless normal lung function would be consistent with absence of interstitial lung disease.  I have communicated this with the patient.  There is air trapping.  She does have dyspnea on exertion.  She understands her dyspnea could be because of obesity and also physical deconditioning.  She is willing to try empiric inhaler for the air trapping.  She was in tears of joy upon hearing that she does not have overt ILD.   Symptoms:   CT Chest data 0 HRCT 01/19/21  Narrative & Impression  CLINICAL DATA:  Interstitial lung disease   EXAM: CT CHEST WITHOUT CONTRAST   TECHNIQUE: Multidetector CT imaging of the chest was performed following the standard protocol without intravenous contrast. High resolution imaging of the lungs, as well as  inspiratory and expiratory imaging, was performed.   COMPARISON:  November 24, 2020 chest CT   FINDINGS: Cardiovascular: Normal heart size. No pericardial effusion. No significant coronary artery calcifications. Mild atherosclerotic disease of the thoracic aorta.   Mediastinum/Nodes: Small hiatal hernia. Thyroid is unremarkable. No enlarged lymph nodes seen in the chest.   Lungs/Pleura: Central airways are patent. Moderate bilateral air trapping. No consolidation, pleural effusion pneumothorax. Linear opacities of the lingula and left lower lobe which persist with prone imaging, likely due to scarring. No evidence of traction bronchiectasis or honeycomb change.   Upper Abdomen: Hepatic steatosis. A stone is seen in  the gallbladder neck. No acute abnormality.   Musculoskeletal: Multilevel degenerative disc disease. No aggressive appearing osseous lesions.   IMPRESSION: 1. Moderate bilateral air trapping, findings can be seen in the setting of small airways disease. 2. No evidence of fibrotic interstitial lung disease 3. A stone is seen in the gallbladder neck. 4. Aortic Atherosclerosis (ICD10-I70.0).     Electronically Signed   By: Rea Marc M.D.   On: 01/19/2021 09:42      No results found.    Simple office walk 185 feet x  3 laps goal with forehead probe 12/18/2020  02/26/2021 Changed lexapro to wellbutrn  O2 used ra   Number laps completed 3   Comments about pace mod   Resting Pulse Ox/HR 99% and 91/min   Final Pulse Ox/HR 94% and 131/min   Desaturated </= 88% no   Desaturated <= 3% points yes   Got Tachycardic >/= 90/min yes   Symptoms at end of test Moderte dyspnea   Miscellaneous comments x     PFT  PFT Results Latest Ref Rng & Units 02/13/2021  FVC-Pre L 2.45  FVC-Predicted Pre % 82  FVC-Post L 2.55  FVC-Predicted Post % 86  Pre FEV1/FVC % % 77  Post FEV1/FCV % % 79  FEV1-Pre L 1.89  FEV1-Predicted Pre % 84  FEV1-Post L 2.02  DLCO  uncorrected ml/min/mmHg 16.55  DLCO UNC% % 85  DLCO corrected ml/min/mmHg 16.55  DLCO COR %Predicted % 85  DLVA Predicted % 93  TLC L 5.29  TLC % Predicted % 104  RV % Predicted % 105     Latest Reference Range & Units 12/18/20 15:33  Micropolyspora faeni, IgG Negative  Negative    Latest Reference Range & Units 12/18/20 15:33 12/18/20 15:34  Anti Nuclear Antibody (ANA) NEGATIVE  NEGATIVE   Cyclic Citrullin Peptide Ab UNITS <16   ds DNA Ab IU/mL 1   RA Latex Turbid. <14 IU/mL <14   SSA (Ro) (ENA) Antibody, IgG <1.0 NEG AI <1.0 NEG   SSB (La) (ENA) Antibody, IgG <1.0 NEG AI <1.0 NEG   Scleroderma (Scl-70) (ENA) Antibody, IgG <1.0 NEG AI <1.0 NEG   QUANTIFERON-TB GOLD PLUS   Rpt  A.Fumigatus #1 Abs Negative  Negative   Micropolyspora faeni, IgG Negative  Negative   Thermoactinomyces vulgaris, IgG Negative  Negative   A. Pullulans Abs Negative  Negative   Thermoact. Saccharii Negative  Negative   Pigeon Serum Abs Negative  Negative   Rpt: View report in Results Review for more information    OV 03/15/2022  Subjective:  Patient ID: Stacy Tucker, female , DOB: 01-06-1951 , age 37 y.o. , MRN: [id number] , ADDRESS: [address] [place] [facility] [address] PCP Larnell Hamilton, MD Patient Care Team: Larnell Hamilton, MD as PCP - General (Internal Medicine) Francyne Headland, MD as Consulting Physician (Cardiology)  This Provider for this visit: Treatment Team:  Attending Provider: Geronimo Amel, MD    03/15/2022 -   Chief Complaint  Patient presents with   Follow-up    Pt is here for follow up for asthma. Pt is currently on Symbicort  daily and Albuterol  as needed. Pt reports not having to use the Albuterol  at all since last year. Pt states she is doing well with no issues., ACT score was 21.    HPI Stacy Tucker 73 y.o. -returns for 1 year follow-up.  In the last 1 year she has been on Symbicort  started empirically based on air-trapping and shortness  of  breath.  She says it is done wonders for her.  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  In October 2023 went to [place].  She walked 22 miles in 7 days and did not have any shortness of breath.  She attributes all this to Symbicort .  Her quality of life is tremendously improved.  She also is now reporting history of chronic on and off nasal allergies.  She states she has taken nasal antihistamine spray presumably but this has not helped and is irritating her nose.  We discussed about using saline spray as an alternative and then if this does not help moving on to taking over-the-counter antihistaminic tablets.  Review the records indicate that she has never had a RAST allergy panel.  But at this point in time she is feeling better.    OV 08/29/2023  Subjective:  Patient ID: Stacy Tucker, female , DOB: Jun 29, 1950 , age 12 y.o. , MRN: [id number] , ADDRESS: [address] [place] [facility] [address] PCP Larnell Hamilton, MD Patient Care Team: Larnell Hamilton, MD as PCP - General (Internal Medicine) Francyne Headland, MD as Consulting Physician (Cardiology)  This Provider for this visit: Treatment Team:  Attending Provider: Geronimo Amel, MD   #Follow-up pulmonary air-trapping clinically being treated as mild persistent asthma/moderate persistent asthma #History of nasal allergies  08/29/2023 -  No chief complaint on file.    HPI Stacy Tucker 73 y.o. -     SYMPTOM SCALE - 12/18/2020 02/26/2021   Current weight    O2 use ra ra  Shortness of Breath 0 -> 5 scale with 5 being worst (score 6 If unable to do)   At rest 0.25 0  Simple tasks - showers, clothes change, eating, shaving 0.25 1  Household (dishes, doing bed, laundry) 0.25 1  Shopping 0.25 1  Walking level at own pace [place] for a while - 2 blocks 2  Walking up Stairs sometimes 2  Total (30-36) Dyspnea Score x 7  How bad is your  cough? 0 0  How bad is your fatigue 2 1  How bad is nausea 0 0  How bad is vomiting?  0 0  How bad is diarrhea? 2 0  How bad is anxiety? ok 1  How bad is depression Takes lexapro and fine 1  Any chronic pain - if so where and how bad Chronic arthritis pain    CT Chest data from date: ****  - personally visualized and independently interpreted : *** - my findings are: ***   PFT     Latest Ref Rng & Units 02/13/2021   11:09 AM  PFT Results  FVC-Pre L 2.45   FVC-Predicted Pre % 82   FVC-Post L 2.55   FVC-Predicted Post % 86   Pre FEV1/FVC % % 77   Post FEV1/FCV % % 79   FEV1-Pre L 1.89   FEV1-Predicted Pre % 84   FEV1-Post L 2.02   DLCO uncorrected ml/min/mmHg 16.55   DLCO UNC% % 85   DLCO corrected ml/min/mmHg 16.55   DLCO COR %Predicted % 85   DLVA Predicted % 93   TLC L 5.29   TLC % Predicted % 104   RV % Predicted % 105        LAB RESULTS last 96 hours No results found.       has a past medical history  of Allergy, Anal skin tags (10/18/2016), Arthritis, Asthma, Cancer (HCC), Chicken pox, Depression, Endometriosis, External hemorrhoid, H/O bladder infections, adenomatous polyp of colon (04/24/2014), Hypertension, Hypothyroidism, ILD (interstitial lung disease) (HCC), Internal hemorrhoid, Pneumonia, Prolapsed internal hemorrhoids, grade 2 (10/18/2016), and Vaginal delivery.   reports that she quit smoking about 8 years ago. Her smoking use included cigarettes. She started smoking about 53 years ago. She has a 45 pack-year smoking history. She has never used smokeless tobacco.  Past Surgical History:  Procedure Laterality Date   CARDIAC CATHETERIZATION     all normal    COLONOSCOPY W/ POLYPECTOMY     HEMORRHOID BANDING     TOTAL KNEE ARTHROPLASTY Left 2012   wisdom  teeth     no sedation with this, just novacaine    Allergies  Allergen Reactions   Doxycycline Swelling   Augmentin [Amoxicillin-Pot Clavulanate] Nausea And Vomiting   Hydrocodone Other  (See Comments)    Eyes would cross with this    Influenza Vaccines Other (See Comments)    Fever, severe flu symptoms   Lorazepam Hives   Macrodantin [Nitrofurantoin Macrocrystal] Hives   Penicillins Other (See Comments)    As child    Pristiq [Desvenlafaxine] Other (See Comments)    Fever and legs stiff, couldn't ambulate    Sulfa Antibiotics Rash    Immunization History  Administered Date(s) Administered   Janssen (J&J) SARS-COV-2 Vaccination 03/27/2020   Tdap 04/22/2013   Unspecified SARS-COV-2 Vaccination 03/27/2020   Zoster, Live 04/04/2015, 08/04/2015    Family History  Problem Relation Age of Onset   Colon cancer Paternal Grandmother 109       no intervention   Arthritis Paternal Grandmother    Dementia Paternal Grandmother    Arthritis Mother    Heart disease Mother    Hypertension Mother    Depression Mother    Dementia Mother    Heart attack Mother    Other Mother        C-Diff, and also had flesh eating bateria (strep type) on her right calf   Arthritis Father    Alcohol  abuse Paternal Grandfather    Hypertension Brother    Depression Brother    Arthritis Daughter    Breast cancer Maternal Aunt    Hypertension Brother    Depression Brother    Breast cancer Cousin    Esophageal cancer Neg Hx    Rectal cancer Neg Hx    Stomach cancer Neg Hx      Current Outpatient Medications:    albuterol  (VENTOLIN  HFA) 108 (90 Base) MCG/ACT inhaler, INHALE 1 TO 2 PUFFS BY MOUTH EVERY 4 HOURS AS NEEDED for 30, Disp: 18 g, Rfl: 4   betamethasone  valerate (VALISONE ) 0.1 % cream, Apply 1 application topically daily as needed (for skin irritation). , Disp: , Rfl:    budesonide -formoterol  (SYMBICORT ) 80-4.5 MCG/ACT inhaler, INHALE 2 PUFFS INTO THE LUNGS IN THE MORNING AND AT BEDTIME., Disp: 30.6 g, Rfl: 0   buPROPion (WELLBUTRIN XL) 150 MG 24 hr tablet, Take 150 mg by mouth daily., Disp: , Rfl:    Chlorphen-PE-Acetaminophen (CORICIDIN D COLD/FLU/SINUS PO), Take 1 tablet  by mouth daily as needed (for allergy)., Disp: , Rfl:    cholecalciferol (VITAMIN D) 1000 units tablet, Take 2,000 Units by mouth daily., Disp: , Rfl:    estradiol (ESTRACE) 0.1 MG/GM vaginal cream, Place 1 Applicatorful vaginally once a week., Disp: , Rfl:    hydrocortisone  (ANUSOL -HC) 2.5 % rectal cream, APPLY RECTALLY TWICE A DAY, Disp:  30 g, Rfl: 0   hydrocortisone  (ANUSOL -HC) 25 MG suppository, Place 25 mg rectally 2 (two) times daily., Disp: , Rfl:    ibuprofen (ADVIL) 100 MG tablet, Take 200 mg by mouth every 6 (six) hours as needed for pain or fever., Disp: , Rfl:    levothyroxine (SYNTHROID, LEVOTHROID) 75 MCG tablet, Take 75 mcg by mouth daily before breakfast., Disp: , Rfl:    meloxicam (MOBIC) 15 MG tablet, Take 15 mg by mouth daily., Disp: , Rfl:    olmesartan (BENICAR) 40 MG tablet, olmesartan 40 mg tablet  TAKE 1 TABLET BY MOUTH EVERY DAY FOR 90 DAYS, Disp: , Rfl:    Omega-3 Fatty Acids (FISH OIL) 1000 MG CAPS, Take 2,000 mg by mouth daily., Disp: , Rfl:       Objective:   There were no vitals filed for this visit.  Estimated body mass index is 36.6 kg/m as calculated from the following:   Height as of 03/15/22: 5' 4 (1.626 m).   Weight as of 03/15/22: 213 lb 3.2 oz (96.7 kg).  @WEIGHTCHANGE @  There were no vitals filed for this visit.   Physical Exam   General: No distress. *** O2 at rest: *** Cane present: *** Sitting in wheel chair: *** Frail: *** Obese: *** Neuro: Alert and Oriented x 3. GCS 15. Speech normal Psych: Pleasant Resp:  Barrel Chest - ***.  Wheeze - ***, Crackles - ***, No overt respiratory distress CVS: Normal heart sounds. Murmurs - *** Ext: Stigmata of Connective Tissue Disease - *** HEENT: Normal upper airway. PEERL +. No post nasal drip        Assessment:     No diagnosis found.     Plan:     There are no Patient Instructions on file for this visit.   FOLLOWUP No follow-ups on file.    SIGNATURE    Dr. Dorethia Cave, M.D., F.C.C.P,  Pulmonary and Critical Care Medicine Staff Physician, [facility] Director - Interstitial Lung Disease  Program  Pulmonary Fibrosis [facility] [place], [place], [address]  Pager: [phone], If no answer or between  15:00h - 7:00h: call [phone] Telephone: [phone]  5:45 PM 08/29/2023   Moderate Complexity MDM OFFICE  2021 E/M guidelines, first released in 2021, with minor revisions added in 2023 and 2024 Must meet the requirements for 2 out of 3 dimensions to qualify.    Number and complexity of problems addressed Amount and/or complexity of data reviewed Risk of complications and/or morbidity  One or more chronic illness with mild exacerbation, OR progression, OR  side effects of treatment  Two or more stable chronic illnesses  One undiagnosed new problem with uncertain prognosis  One acute illness with systemic symptoms   One Acute complicated injury Must meet the requirements for 1 of 3 of the categories)  Category 1: Tests and documents, historian  Any combination of 3 of the following:  Assessment requiring an independent historian  Review of prior external note(s) from each unique source  Review of results of each unique test  Ordering of each unique test    Category 2: Interpretation of tests   Independent interpretation of a test performed by another physician/other qualified health care professional (not separately reported)  Category 3: Discuss management/tests  Discussion of management or test interpretation with external physician/other qualified health care professional/appropriate source (not separately reported) Moderate risk of morbidity from additional diagnostic  testing or treatment Examples only:  Prescription drug management  Decision regarding minor surgery with identfied patient or procedure risk factors  Decision regarding elective major  surgery without identified patient or procedure risk factors  Diagnosis or treatment significantly limited by social determinants of health             HIGh Complexity  OFFICE   2021 E/M guidelines, first released in 2021, with minor revisions added in 2023. Must meet the requirements for 2 out of 3 dimensions to qualify.    Number and complexity of problems addressed Amount and/or complexity of data reviewed Risk of complications and/or morbidity  Severe exacerbation of chronic illness  Acute or chronic illnesses that may pose a threat to life or bodily function, e.g., multiple trauma, acute MI, pulmonary embolus, severe respiratory distress, progressive rheumatoid arthritis, psychiatric illness with potential threat to self or others, peritonitis, acute renal failure, abrupt change in neurological status Must meet the requirements for 2 of 3 of the categories)  Category 1: Tests and documents, historian  Any combination of 3 of the following:  Assessment requiring an independent historian  Review of prior external note(s) from each unique source  Review of results of each unique test  Ordering of each unique test    Category 2: Interpretation of tests    Independent interpretation of a test performed by another physician/other qualified health care professional (not separately reported)  Category 3: Discuss management/tests  Discussion of management or test interpretation with external physician/other qualified health care professional/appropriate source (not separately reported)  HIGH risk of morbidity from additional diagnostic testing or treatment Examples only:  Drug therapy requiring intensive monitoring for toxicity  Decision for elective major surgery with identified pateint or procedure risk factors  Decision regarding hospitalization or escalation of level of care  Decision for DNR or to de-escalate care   Parenteral controlled  substances             LEGEND - Independent interpretation involves the interpretation of a test for which there is a CPT code, and an interpretation or report is customary. When a review and interpretation of a test is performed and documented by the provider, but not separately reported (billed), then this would represent an independent interpretation. This report does not need to conform to the usual standards of a complete report of the test. This does not include interpretation of tests that do not have formal reports such as a complete blood count with differential and blood cultures. Examples would include reviewing a chest radiograph and documenting in the medical record an interpretation, but not separately reporting (billing) the interpretation of the chest radiograph.   An appropriate source includes professionals who are not health care professionals but may be involved in the management of the patient, such as a Clinical research associate, upper officer, case manager or teacher, and does not include discussion with family or informal caregivers.    - SDOH: SDOH are the conditions in the environments where people are born, live, learn, work, play, worship, and age that affect a wide range of health, functioning, and quality-of-life outcomes and risks. (e.g., housing, food insecurity, transportation, etc.). SDOH-related Z codes ranging from Z55-Z65 are the ICD-10-CM diagnosis codes used to document SDOH data Z55 - Problems related to education and literacy Z56 - Problems related to employment and unemployment Z57 - Occupational exposure to risk factors Z58 - Problems related to physical environment Z59 - Problems related to housing and economic circumstances [phone] - Problems related  to social environment [phone] - Problems related to upbringing [phone] - Other problems related to primary support group, including family circumstances Z82 - Problems related to certain psychosocial circumstances Z65 - Problems related to other  psychosocial circumstances

## 2023-08-30 ENCOUNTER — Encounter: Payer: Self-pay | Admitting: Internal Medicine

## 2023-08-30 ENCOUNTER — Ambulatory Visit: Admitting: Internal Medicine

## 2023-08-30 VITALS — BP 118/62 | HR 78 | Temp 98.8°F | Ht 63.0 in | Wt 206.4 lb

## 2023-08-30 DIAGNOSIS — Z87891 Personal history of nicotine dependence: Secondary | ICD-10-CM

## 2023-08-30 DIAGNOSIS — E66812 Obesity, class 2: Secondary | ICD-10-CM | POA: Diagnosis not present

## 2023-08-30 DIAGNOSIS — R7303 Prediabetes: Secondary | ICD-10-CM | POA: Diagnosis not present

## 2023-08-30 DIAGNOSIS — Z122 Encounter for screening for malignant neoplasm of respiratory organs: Secondary | ICD-10-CM

## 2023-08-30 DIAGNOSIS — Z6836 Body mass index (BMI) 36.0-36.9, adult: Secondary | ICD-10-CM | POA: Diagnosis not present

## 2023-08-30 DIAGNOSIS — I10 Essential (primary) hypertension: Secondary | ICD-10-CM | POA: Diagnosis not present

## 2023-08-30 DIAGNOSIS — J4530 Mild persistent asthma, uncomplicated: Secondary | ICD-10-CM

## 2023-08-30 MED ORDER — AIRSUPRA 90-80 MCG/ACT IN AERO: 2.0000 | INHALATION_SPRAY | RESPIRATORY_TRACT | 2 refills | Status: AC | PRN

## 2023-08-30 MED ORDER — BUDESONIDE-FORMOTEROL FUMARATE 80-4.5 MCG/ACT IN AERO: 2.0000 | INHALATION_SPRAY | Freq: Two times a day (BID) | RESPIRATORY_TRACT | 1 refills | Status: DC

## 2023-08-30 NOTE — Addendum Note (Signed)
 Addended by: ROLANDA POWELL SAILOR on: 08/30/2023 10:21 AM   Modules accepted: Orders

## 2023-08-30 NOTE — Patient Instructions (Addendum)
 ICD-10-CM   1. Mild persistent asthma without complication  J45.30     2. Stopped smoking with greater than 40 pack year history  Z87.891     3. Screening for lung cancer  Z12.2        Pulmonary air trapping DOE (dyspnea on exertion) Mild persistent asthma without complication   - clinical behavior is one of low grade persistent asthma; glad you are better with symbicort   plan -Try tapering Symbicort  to AIRSUPRA  - take 2 puff as needed (This is a hybrid inhaler between albuterol  and symbicort )  - with Airsupra  you do not need albuterol   - take discount card - if still expensive then just try Symbicort  prn   - CMA to send script for symbicort  as well - weight loss will help astghma control  - glad you are taking effort  Allergic rhinitis, unspecified seasonality, unspecified trigger  Plan  - I would recommend starting with saline nasal spray and if this does not work you can use an antihistamine pill [over-the-counter allergy tablet] -If symptoms are out of control then we can always check blood for RAST allergy test in the future as needed  Quti smoking > 45 pack smoker Lung cancer screening  Plan  - refer back to Lung cancer screening program  - get LDCT first avaiable nedt few weeks   Followup  - return in 6 months or sooner if needed;15  min visit

## 2023-08-31 ENCOUNTER — Other Ambulatory Visit: Payer: Self-pay | Admitting: Internal Medicine

## 2023-08-31 DIAGNOSIS — J4530 Mild persistent asthma, uncomplicated: Secondary | ICD-10-CM

## 2023-09-05 DIAGNOSIS — H25813 Combined forms of age-related cataract, bilateral: Secondary | ICD-10-CM | POA: Diagnosis not present

## 2023-09-09 ENCOUNTER — Ambulatory Visit
Admission: RE | Admit: 2023-09-09 | Discharge: 2023-09-09 | Disposition: A | Discharge status: Home / Self Care | Source: Ambulatory Visit | Attending: Internal Medicine | Admitting: Internal Medicine

## 2023-09-09 DIAGNOSIS — Z87891 Personal history of nicotine dependence: Secondary | ICD-10-CM | POA: Diagnosis not present

## 2023-09-09 DIAGNOSIS — Z122 Encounter for screening for malignant neoplasm of respiratory organs: Secondary | ICD-10-CM

## 2023-09-09 DIAGNOSIS — J4530 Mild persistent asthma, uncomplicated: Secondary | ICD-10-CM

## 2023-09-19 DIAGNOSIS — E66812 Obesity, class 2: Secondary | ICD-10-CM | POA: Diagnosis not present

## 2023-09-19 DIAGNOSIS — Z6835 Body mass index (BMI) 35.0-35.9, adult: Secondary | ICD-10-CM | POA: Diagnosis not present

## 2023-09-19 DIAGNOSIS — I10 Essential (primary) hypertension: Secondary | ICD-10-CM | POA: Diagnosis not present

## 2023-09-19 DIAGNOSIS — E65 Localized adiposity: Secondary | ICD-10-CM | POA: Diagnosis not present

## 2023-09-19 DIAGNOSIS — K760 Fatty (change of) liver, not elsewhere classified: Secondary | ICD-10-CM | POA: Diagnosis not present

## 2023-09-19 DIAGNOSIS — K5900 Constipation, unspecified: Secondary | ICD-10-CM | POA: Diagnosis not present

## 2023-09-19 DIAGNOSIS — R7303 Prediabetes: Secondary | ICD-10-CM | POA: Diagnosis not present

## 2023-10-06 DIAGNOSIS — R197 Diarrhea, unspecified: Secondary | ICD-10-CM | POA: Diagnosis not present

## 2023-10-06 DIAGNOSIS — I10 Essential (primary) hypertension: Secondary | ICD-10-CM | POA: Diagnosis not present

## 2023-10-06 DIAGNOSIS — E559 Vitamin D deficiency, unspecified: Secondary | ICD-10-CM | POA: Diagnosis not present

## 2023-10-06 DIAGNOSIS — E039 Hypothyroidism, unspecified: Secondary | ICD-10-CM | POA: Diagnosis not present

## 2023-10-10 DIAGNOSIS — Z6834 Body mass index (BMI) 34.0-34.9, adult: Secondary | ICD-10-CM | POA: Diagnosis not present

## 2023-10-10 DIAGNOSIS — E65 Localized adiposity: Secondary | ICD-10-CM | POA: Diagnosis not present

## 2023-10-10 DIAGNOSIS — E669 Obesity, unspecified: Secondary | ICD-10-CM | POA: Diagnosis not present

## 2023-10-10 DIAGNOSIS — I10 Essential (primary) hypertension: Secondary | ICD-10-CM | POA: Diagnosis not present

## 2023-10-10 DIAGNOSIS — R7303 Prediabetes: Secondary | ICD-10-CM | POA: Diagnosis not present

## 2023-10-10 DIAGNOSIS — R197 Diarrhea, unspecified: Secondary | ICD-10-CM | POA: Diagnosis not present

## 2023-10-10 DIAGNOSIS — K760 Fatty (change of) liver, not elsewhere classified: Secondary | ICD-10-CM | POA: Diagnosis not present

## 2023-10-19 DIAGNOSIS — R350 Frequency of micturition: Secondary | ICD-10-CM | POA: Diagnosis not present

## 2023-10-19 DIAGNOSIS — N390 Urinary tract infection, site not specified: Secondary | ICD-10-CM | POA: Diagnosis not present

## 2023-10-19 DIAGNOSIS — R300 Dysuria: Secondary | ICD-10-CM | POA: Diagnosis not present

## 2023-10-19 DIAGNOSIS — R3915 Urgency of urination: Secondary | ICD-10-CM | POA: Diagnosis not present

## 2023-10-28 ENCOUNTER — Ambulatory Visit: Payer: Self-pay | Admitting: Internal Medicine

## 2023-10-28 NOTE — Progress Notes (Signed)
 Good news no lung cancer. Please make sure you are getting yearly scans for lung cancer screen till age 74. Please enroll with the lung cancer screening program.

## 2023-12-13 DIAGNOSIS — I10 Essential (primary) hypertension: Secondary | ICD-10-CM | POA: Diagnosis not present

## 2023-12-13 DIAGNOSIS — E669 Obesity, unspecified: Secondary | ICD-10-CM | POA: Diagnosis not present

## 2023-12-13 DIAGNOSIS — Z6834 Body mass index (BMI) 34.0-34.9, adult: Secondary | ICD-10-CM | POA: Diagnosis not present

## 2023-12-13 DIAGNOSIS — K760 Fatty (change of) liver, not elsewhere classified: Secondary | ICD-10-CM | POA: Diagnosis not present

## 2023-12-13 DIAGNOSIS — E65 Localized adiposity: Secondary | ICD-10-CM | POA: Diagnosis not present

## 2023-12-13 DIAGNOSIS — R7303 Prediabetes: Secondary | ICD-10-CM | POA: Diagnosis not present

## 2024-01-09 DIAGNOSIS — I10 Essential (primary) hypertension: Secondary | ICD-10-CM | POA: Diagnosis not present

## 2024-01-09 DIAGNOSIS — F419 Anxiety disorder, unspecified: Secondary | ICD-10-CM | POA: Diagnosis not present

## 2024-01-09 DIAGNOSIS — Z0000 Encounter for general adult medical examination without abnormal findings: Secondary | ICD-10-CM | POA: Diagnosis not present

## 2024-01-09 DIAGNOSIS — I700 Atherosclerosis of aorta: Secondary | ICD-10-CM | POA: Diagnosis not present

## 2024-01-09 DIAGNOSIS — E1169 Type 2 diabetes mellitus with other specified complication: Secondary | ICD-10-CM | POA: Diagnosis not present

## 2024-01-09 DIAGNOSIS — Z1331 Encounter for screening for depression: Secondary | ICD-10-CM | POA: Diagnosis not present

## 2024-01-09 DIAGNOSIS — F330 Major depressive disorder, recurrent, mild: Secondary | ICD-10-CM | POA: Diagnosis not present

## 2024-01-09 DIAGNOSIS — J45909 Unspecified asthma, uncomplicated: Secondary | ICD-10-CM | POA: Diagnosis not present

## 2024-01-09 DIAGNOSIS — E039 Hypothyroidism, unspecified: Secondary | ICD-10-CM | POA: Diagnosis not present

## 2024-01-09 DIAGNOSIS — Z1339 Encounter for screening examination for other mental health and behavioral disorders: Secondary | ICD-10-CM | POA: Diagnosis not present

## 2024-01-09 DIAGNOSIS — Z6835 Body mass index (BMI) 35.0-35.9, adult: Secondary | ICD-10-CM | POA: Diagnosis not present

## 2024-03-02 ENCOUNTER — Other Ambulatory Visit: Payer: Self-pay | Admitting: Internal Medicine

## 2024-03-02 DIAGNOSIS — J4530 Mild persistent asthma, uncomplicated: Secondary | ICD-10-CM

## 2024-06-26 ENCOUNTER — Ambulatory Visit: Admitting: Internal Medicine
